# Patient Record
Sex: Male | Born: 1950
Health system: Southern US, Community
[De-identification: ages and names within clinical notes are randomized; demographics above are authoritative.]

## PROBLEM LIST (undated history)

## (undated) DIAGNOSIS — E785 Hyperlipidemia, unspecified: Secondary | ICD-10-CM

## (undated) DIAGNOSIS — I10 Essential (primary) hypertension: Secondary | ICD-10-CM

---

## 2006-11-29 ENCOUNTER — Other Ambulatory Visit: Payer: Self-pay

## 2006-11-29 ENCOUNTER — Emergency Department: Payer: Self-pay

## 2007-04-30 ENCOUNTER — Encounter: Admission: RE | Admit: 2007-04-30 | Discharge: 2007-04-30 | Payer: Self-pay | Admitting: Internal Medicine

## 2008-07-10 ENCOUNTER — Ambulatory Visit: Payer: Self-pay | Admitting: Otolaryngology

## 2009-07-09 ENCOUNTER — Ambulatory Visit: Payer: Self-pay | Admitting: Unknown Physician Specialty

## 2015-04-09 ENCOUNTER — Other Ambulatory Visit: Payer: Self-pay | Admitting: Orthopedic Surgery

## 2015-04-09 DIAGNOSIS — M67431 Ganglion, right wrist: Secondary | ICD-10-CM

## 2015-04-09 DIAGNOSIS — G5621 Lesion of ulnar nerve, right upper limb: Secondary | ICD-10-CM

## 2015-04-16 ENCOUNTER — Ambulatory Visit
Admission: RE | Admit: 2015-04-16 | Discharge: 2015-04-16 | Disposition: A | Discharge status: Home / Self Care | Payer: BLUE CROSS/BLUE SHIELD | Source: Ambulatory Visit | Attending: Orthopedic Surgery | Admitting: Orthopedic Surgery

## 2015-04-16 DIAGNOSIS — G5621 Lesion of ulnar nerve, right upper limb: Secondary | ICD-10-CM

## 2015-04-16 DIAGNOSIS — I728 Aneurysm of other specified arteries: Secondary | ICD-10-CM | POA: Diagnosis not present

## 2015-04-16 DIAGNOSIS — M659 Synovitis and tenosynovitis, unspecified: Secondary | ICD-10-CM | POA: Insufficient documentation

## 2015-04-16 DIAGNOSIS — M67431 Ganglion, right wrist: Secondary | ICD-10-CM

## 2015-04-16 DIAGNOSIS — M19031 Primary osteoarthritis, right wrist: Secondary | ICD-10-CM | POA: Insufficient documentation

## 2015-04-16 DIAGNOSIS — M25431 Effusion, right wrist: Secondary | ICD-10-CM | POA: Insufficient documentation

## 2015-05-27 ENCOUNTER — Encounter
Admission: RE | Disposition: A | Discharge status: Home / Self Care | Payer: Self-pay | Source: Ambulatory Visit | Attending: Vascular Surgery

## 2015-05-27 ENCOUNTER — Encounter: Payer: Self-pay | Admitting: *Deleted

## 2015-05-27 ENCOUNTER — Ambulatory Visit
Admission: RE | Admit: 2015-05-27 | Discharge: 2015-05-27 | Disposition: A | Discharge status: Home / Self Care | Payer: BLUE CROSS/BLUE SHIELD | Source: Ambulatory Visit | Attending: Vascular Surgery | Admitting: Vascular Surgery

## 2015-05-27 DIAGNOSIS — Z7982 Long term (current) use of aspirin: Secondary | ICD-10-CM | POA: Diagnosis not present

## 2015-05-27 DIAGNOSIS — I7389 Other specified peripheral vascular diseases: Secondary | ICD-10-CM | POA: Insufficient documentation

## 2015-05-27 DIAGNOSIS — E785 Hyperlipidemia, unspecified: Secondary | ICD-10-CM | POA: Insufficient documentation

## 2015-05-27 DIAGNOSIS — I10 Essential (primary) hypertension: Secondary | ICD-10-CM | POA: Diagnosis not present

## 2015-05-27 DIAGNOSIS — Z79899 Other long term (current) drug therapy: Secondary | ICD-10-CM | POA: Diagnosis not present

## 2015-05-27 HISTORY — DX: Hyperlipidemia, unspecified: E78.5

## 2015-05-27 HISTORY — PX: PERIPHERAL VASCULAR CATHETERIZATION: SHX172C

## 2015-05-27 HISTORY — DX: Essential (primary) hypertension: I10

## 2015-05-27 LAB — BASIC METABOLIC PANEL
Anion gap: 8 (ref 5–15)
BUN: 15 mg/dL (ref 6–20)
CALCIUM: 9.3 mg/dL (ref 8.9–10.3)
CO2: 25 mmol/L (ref 22–32)
CREATININE: 0.88 mg/dL (ref 0.61–1.24)
Chloride: 105 mmol/L (ref 101–111)
GFR calc non Af Amer: >60 mL/min (ref >60)
Glucose, Bld: 125 mg/dL — ABNORMAL HIGH (ref 65–99)
Potassium: 4.2 mmol/L (ref 3.5–5.1)
Sodium: 138 mmol/L (ref 135–145)

## 2015-05-27 SURGERY — UPPER EXTREMITY ANGIOGRAPHY
Anesthesia: Moderate Sedation | Laterality: Right

## 2015-05-27 MED ORDER — MIDAZOLAM HCL 5 MG/5ML IJ SOLN
INTRAMUSCULAR | Status: AC
  Filled 2015-05-27: qty 5

## 2015-05-27 MED ORDER — DEXTROSE 50 % IV SOLN: 0.5000 | Freq: Once | INTRAVENOUS | Status: DC | PRN

## 2015-05-27 MED ORDER — FENTANYL CITRATE (PF) 100 MCG/2ML IJ SOLN
INTRAMUSCULAR | Status: AC
  Filled 2015-05-27: qty 2

## 2015-05-27 MED ORDER — HEPARIN (PORCINE) IN NACL 2-0.9 UNIT/ML-% IJ SOLN
INTRAMUSCULAR | Status: AC
  Filled 2015-05-27: qty 1000

## 2015-05-27 MED ORDER — CEFAZOLIN SODIUM 1-5 GM-% IV SOLN
1.0000 g | Freq: Once | INTRAVENOUS | Status: AC
  Administered 2015-05-27: 1 g via INTRAVENOUS

## 2015-05-27 MED ORDER — CEFAZOLIN SODIUM 1-5 GM-% IV SOLN
INTRAVENOUS | Status: AC
  Filled 2015-05-27: qty 50

## 2015-05-27 MED ORDER — LIDOCAINE-EPINEPHRINE (PF) 1 %-1:200000 IJ SOLN
INTRAMUSCULAR | Status: AC
  Filled 2015-05-27: qty 30

## 2015-05-27 MED ORDER — HEPARIN SODIUM (PORCINE) 1000 UNIT/ML IJ SOLN
INTRAMUSCULAR | Status: AC
  Filled 2015-05-27: qty 1

## 2015-05-27 MED ORDER — HYDROMORPHONE HCL 1 MG/ML IJ SOLN: 1.0000 mg | INTRAMUSCULAR | Status: DC | PRN

## 2015-05-27 MED ORDER — MIDAZOLAM HCL 2 MG/2ML IJ SOLN
INTRAMUSCULAR | Status: DC | PRN
  Administered 2015-05-27: 2 mg via INTRAVENOUS

## 2015-05-27 MED ORDER — IOHEXOL 300 MG/ML  SOLN
INTRAMUSCULAR | Status: DC | PRN
  Administered 2015-05-27: 45 mL via INTRA_ARTERIAL

## 2015-05-27 MED ORDER — SODIUM CHLORIDE 0.9 % IV SOLN
INTRAVENOUS | Status: DC
  Administered 2015-05-27: 11:00:00 via INTRAVENOUS

## 2015-05-27 MED ORDER — FENTANYL CITRATE (PF) 100 MCG/2ML IJ SOLN
INTRAMUSCULAR | Status: DC | PRN
Start: 2015-05-27 — End: 2015-05-27
  Administered 2015-05-27: 50 ug via INTRAVENOUS

## 2015-05-27 MED ORDER — ONDANSETRON HCL 4 MG/2ML IJ SOLN: 4.0000 mg | INTRAMUSCULAR | Status: DC | PRN

## 2015-05-27 MED ORDER — HEPARIN SODIUM (PORCINE) 1000 UNIT/ML IJ SOLN
INTRAMUSCULAR | Status: DC | PRN
  Administered 2015-05-27: 3000 [IU] via INTRAVENOUS

## 2015-05-27 SURGICAL SUPPLY — 11 items
CATH CXI SUPP ANG 4FR 135 (MICROCATHETER) ×1 IMPLANT
CATH CXI SUPP ANG 4FR 135CM (MICROCATHETER) ×2
CATH H1 100CM (CATHETERS) ×2 IMPLANT
CATH PIG 5.0X110 10S (CATHETERS) ×2 IMPLANT
DEVICE STARCLOSE SE CLOSURE (Vascular Products) ×2 IMPLANT
GLIDEWIRE ANGLED SS 035X260CM (WIRE) ×2 IMPLANT
PACK ANGIOGRAPHY (CUSTOM PROCEDURE TRAY) ×2 IMPLANT
SHEATH BRITE TIP 5FRX11 (SHEATH) ×2 IMPLANT
SYR MEDRAD MARK V 150ML (SYRINGE) ×2 IMPLANT
TUBING CONTRAST HIGH PRESS 72 (TUBING) ×2 IMPLANT
WIRE J 3MM .035X145CM (WIRE) ×2 IMPLANT

## 2015-05-27 NOTE — H&P (Signed)
[facility] History & Physical Update  The patient was interviewed and re-examined.  The patient's previous History and Physical has been reviewed and is unchanged.  There is no change in the plan of care. We plan to proceed with the scheduled procedure.  Amarisa Wilinski, MD  05/27/2015, 9:52 AM

## 2015-05-27 NOTE — CV Procedure (Signed)
OPERATIVE REPORT   PREOPERATIVE DIAGNOSIS: 1. Hypothenar hammer syndrome right hand  POSTOPERATIVE DIAGNOSIS: 1. Same as above  PROCEDURE PERFORMED: 1. Ultrasound guidance vascular access to right femoral artery. 2. Catheter placement to right radial artery and right ulnar arteries  from right femoral approach. 3. Thoracic aortogram and selective right upper extremity angiogram  including selective images of the radial and ulnar arteries. 4. StarClose closure device right femoral artery.  SURGEON: Algernon Huxley, MD  ANESTHESIA: Local with moderate conscious sedation.  BLOOD LOSS: Minimal.  FLUOROSCOPY TIME: 3 minutes  INDICATION FOR PROCEDURE: This is a 64 year old male who presented to our office with a referral from his orthopedic surgeon after evaluation for right hand pain and weakness. He was found to have hypo-thenar hammer syndrome.To further evaluate this to determine what options would be possible to treat the condition, angiogram of the right upper extremity is indicated. Risks and benefits are discussed. Informed consent was obtained.  DESCRIPTION OF PROCEDURE: The patient was brought to the vascular suite. Groins were shaved and prepped and sterile surgical field was created. The right femoral head was localized with fluoroscopy and the right femoral artery was then visualized with ultrasound and found to be widely patent. It was then accessed under direct ultrasound guidance without difficulty with a Seldinger needle and a permanent image was recorded. A J-wire and 5-French sheath were then placed. Pigtail catheter was placed into the ascending aorta and a thoracic aortogram was then performed in the LAO projection. This demonstrated normal origins to the great vessels without significant proximal stenoses but a bovine configuration was present. The patient was given 3000 units of intravenous heparin and a headhunter catheter was used to  selectively cannulate the innominate artery and advanced into the right subclavian artery without difficulty. This was then sequentially advanced to the brachial artery and to the brachial bifurcation.  Images were performed with injections in the brachial artery and then selectively into the radial artery and ulnar artery. The radial artery was entered first, but the catheter only went to the very origin of the radial artery, then exchanged for a 135 cm CXI catheter to evaluate more distally in the radial artery. After this, the catheter was pulled back into the brachial artery and then advanced and placed into the ulnar artery, this was evaluated. Findings in the right upper extremity showed no significant atherosclerotic disease. The subclavian artery, axillary artery, and brachial arteries were widely patent and normal. There was a normal brachial artery bifurcation. The interosseous artery did not have a lot of flow. The radial artery was normal but the palmaris arch was incomplete and the radial artery only supplied the thumb and the lateral aspect of the second finger. The ulnar artery supplied the medial 3 fingers in the medial aspect of the index finger. Again the palmar arch was seen to be incomplete without good flow. The ulnar artery became quite tortuous at the wrist although a true aneurysm was not really seen. The artery was continuous and not thrombosed. There was hyperemic flow with quick venous refill seen throughout the hand and the right upper extremity. This was most prominent with a direct ulnar injection. At this point, there was no real endovascular option for any of his issues and I elected to terminate his procedure. He has already been referred to a hand surgeon for further evaluation.  The diagnostic catheter was removed. Oblique arteriogram was performed of the right femoral artery and StarClose closure device deployed in the usual  fashion with excellent hemostatic result.  The patient tolerated the procedure well and was taken to the recovery room in stable condition.   Simone Rodenbeck 05/27/2015 1:59 PM

## 2015-05-27 NOTE — Discharge Instructions (Signed)
Groin Insertion Instructions-If you lose feeling or develop tingling or pain in your leg or foot after the procedure, please walk around first.  If the discomfort does not improve , contact your physician and proceed to the nearest emergency room.  Loss of feeling in your leg might mean that a blockage has formed in the artery and this can be appropriately treated.  Limit your activity for the next two days after your procedure.  Avoid stooping, bending, heavy lifting or exertion as this may put pressure on the insertion site.  Resume normal activities in 48 hours.  You may shower after 24 hours but avoid excessive warm water and do not scrub the site.  Remove clear dressing in 48 hours.  If you have had a closure device inserted, do not soak in a tub bath or a hot tub for at least one week.  No driving for 48 hours after discharge.  After the procedure, check the insertion site occasionally.  If any oozing occurs or there is apparent swelling, firm pressure over the site will prevent a bruise from forming.  You can not hurt anything by pressing directly on the site.  The pressure stops the bleeding by allowing a small clot to form.  If the bleeding continues after the pressure has been applied for more than 15 minutes, call 911 or go to the nearest emergency room.    The x-ray dye causes you to pass a considerate amount of urine.  For this reason, you will be asked to drink plenty of liquids after the procedure to prevent dehydration.  You may resume you regular diet.  Avoid caffeine products.    For pain at the site of your procedure, take non-aspirin medicines such as Tylenol.  Medications: A. Hold Metformin for 48 hours if applicable.  B. Continue taking all your present medications at home unless your doctor prescribes any changes.Angiogram, Care After Refer to this sheet in the next few weeks. These instructions provide you with information on caring for yourself after your procedure. Your health care  provider may also give you more specific instructions. Your treatment has been planned according to current medical practices, but problems sometimes occur. Call your health care provider if you have any problems or questions after your procedure.  WHAT TO EXPECT AFTER THE PROCEDURE After your procedure, it is typical to have the following sensations:  Minor discomfort or tenderness and a small bump at the catheter insertion site. The bump should usually decrease in size and tenderness within 1 to 2 weeks.  Any bruising will usually fade within 2 to 4 weeks. HOME CARE INSTRUCTIONS   You may need to keep taking blood thinners if they were prescribed for you. Take medicines only as directed by your health care provider.  Do not apply powder or lotion to the site.  Do not take baths, swim, or use a hot tub until your health care provider approves.  You may shower 24 hours after the procedure. Remove the bandage (dressing) and gently wash the site with plain soap and water. Gently pat the site dry.  Inspect the site at least twice daily.  Limit your activity for the first 48 hours. Do not bend, squat, or lift anything over 20 lb (9 kg) or as directed by your health care provider.  Plan to have someone take you home after the procedure. Follow instructions about when you can drive or return to work. SEEK MEDICAL CARE IF:  You get light-headed when  standing up.  You have drainage (other than a small amount of blood on the dressing).  You have chills.  You have a fever.  You have redness, warmth, swelling, or pain at the insertion site. SEEK IMMEDIATE MEDICAL CARE IF:   You develop chest pain or shortness of breath, feel faint, or pass out.  You have bleeding, swelling larger than a walnut, or drainage from the catheter insertion site.  You develop pain, discoloration, coldness, or severe bruising in the leg or arm that held the catheter.  You develop bleeding from any other place,  such as the bowels. You may see bright red blood in your urine or stools, or your stools may appear black and tarry.  You have heavy bleeding from the site. If this happens, hold pressure on the site. MAKE SURE YOU:  Understand these instructions.  Will watch your condition.  Will get help right away if you are not doing well or get worse. Document Released: 04/27/2005 Document Revised: 02/23/2014 Document Reviewed: 03/03/2013 [facility] Patient Information 2015 [place], [place]. This information is not intended to replace advice given to you by your health care provider. Make sure you discuss any questions you have with your health care provider.

## 2015-05-28 ENCOUNTER — Encounter: Payer: Self-pay | Admitting: Vascular Surgery

## 2016-09-05 ENCOUNTER — Encounter: Payer: Self-pay | Admitting: Podiatry

## 2016-09-05 ENCOUNTER — Ambulatory Visit (INDEPENDENT_AMBULATORY_CARE_PROVIDER_SITE_OTHER): Payer: PRIVATE HEALTH INSURANCE

## 2016-09-05 ENCOUNTER — Ambulatory Visit (INDEPENDENT_AMBULATORY_CARE_PROVIDER_SITE_OTHER): Payer: BLUE CROSS/BLUE SHIELD | Admitting: Podiatry

## 2016-09-05 DIAGNOSIS — M79671 Pain in right foot: Secondary | ICD-10-CM

## 2016-09-05 DIAGNOSIS — M67471 Ganglion, right ankle and foot: Secondary | ICD-10-CM

## 2016-09-05 DIAGNOSIS — M7751 Other enthesopathy of right foot: Secondary | ICD-10-CM | POA: Diagnosis not present

## 2016-09-17 MED ORDER — BETAMETHASONE SOD PHOS & ACET 6 (3-3) MG/ML IJ SUSP: 3.0000 mg | Freq: Once | INTRAMUSCULAR | Status: DC

## 2016-09-17 NOTE — Progress Notes (Signed)
Subjective:  Patient presents today for evaluation of and not developed on his right big toe. Patient states that the lump has been present for the past 3-4 months. Patient denies trauma. Patient presents for further treatment and evaluation.    Objective/Physical Exam General: The patient is alert and oriented x3 in no acute distress.  Dermatology: Skin is warm, dry and supple bilateral lower extremities. Negative for open lesions or macerations.  Vascular: Palpable pedal pulses bilaterally. No edema or erythema noted. Capillary refill within normal limits.  Neurological: Epicritic and protective threshold grossly intact bilaterally.   Musculoskeletal Exam: Palpable, fluctuant nodule noted to the dorsal lateral aspect of the right great toe. Clinical evidence of hallux valgus deformity with possible spurring noted.   Radiographic Exam:  Increased IM angle with osteoarthritic changes noted to the 1st MPJ right foot. Mild dorsal spurring noted.   Assessment: #1 ganglion cyst first MPJ right foot #2 possible abductovalgus right foot #3 DJD first MPJ right foot #4 pain in right Her 5 capsulitis first MPJ right foot   Plan of Care:  #1 Patient was evaluated. #2 injection 0.5 mL Celestone Soluspan injected in the first MPJ right foot #3 rupture of the ganglion cyst was performed with direct pressure and palpation. #4 present dressing applied #5 return to clinic when necessary   Dr. Edrick Kins, [place]

## 2016-11-21 ENCOUNTER — Ambulatory Visit: Payer: PRIVATE HEALTH INSURANCE | Admitting: Podiatry

## 2017-02-26 ENCOUNTER — Ambulatory Visit (INDEPENDENT_AMBULATORY_CARE_PROVIDER_SITE_OTHER): Payer: BLUE CROSS/BLUE SHIELD

## 2017-02-26 ENCOUNTER — Ambulatory Visit (INDEPENDENT_AMBULATORY_CARE_PROVIDER_SITE_OTHER): Payer: BLUE CROSS/BLUE SHIELD | Admitting: Podiatry

## 2017-02-26 ENCOUNTER — Encounter: Payer: Self-pay | Admitting: Podiatry

## 2017-02-26 DIAGNOSIS — M79671 Pain in right foot: Secondary | ICD-10-CM

## 2017-02-26 DIAGNOSIS — M205X1 Other deformities of toe(s) (acquired), right foot: Secondary | ICD-10-CM | POA: Diagnosis not present

## 2017-02-26 DIAGNOSIS — M67471 Ganglion, right ankle and foot: Secondary | ICD-10-CM

## 2017-02-26 NOTE — Progress Notes (Signed)
Subjective:    Patient ID: Gary Wallace, male   DOB: 66 y.o.   MRN: [id number]   HPI patient presents stating he is ready to get his right foot worked on and states that the cyst is back and he does have arthritis in the big toe joint    ROS      Objective:  Physical Exam Neurovascular status intact negative Homans sign was noted with patient's first metatarsal right showing reduced range of motion with approximate 15 of dorsiflexion 10 plantar flexion with no crepitus the joint surface noted. There is a small cyst on the medial side of the right first metatarsal that's freely movable and has been drained with immediate reoccurrence of the lesion    Assessment:    Hallux limitus rigidus deformity right with spur formation but no crepitus of the joint and probable ganglionic cyst     Plan:   H&P and both conditions reviewed. Due to the fact that the joint is functioning relatively well with just large dorsal spurring I've recommended excision of all spurs with modified McBride type bunionectomy to free the joint with the possibility of osteotomy if the symptoms indicate or other treatments depending on the condition of the joint surface. I also recommended excision of the cyst from the right first metatarsal and reviewed the procedure and risk and I allowed patient to read consent form going over alternative treatments complications associated with this surgery. Patient wants surgery understanding everything as listed and is scheduled for outpatient surgery and is dispensed air fracture walker with instructions on usage. Patient is instructed to call systemic any questions prior to procedure and is scheduled for procedure and the next several weeks

## 2017-02-26 NOTE — Patient Instructions (Signed)
Pre-Operative Instructions  Congratulations, you have decided to take an important step to improving your quality of life.  You can be assured that the doctors of [facility] will be with you every step of the way.  1. Plan to be at the surgery center/hospital at least 1 (one) hour prior to your scheduled time unless otherwise directed by the surgical center/hospital staff.  You must have a responsible adult accompany you, remain during the surgery and drive you home.  Make sure you have directions to the surgical center/hospital and know how to get there on time. 2. For hospital based surgery you will need to obtain a history and physical form from your family physician within 1 month prior to the date of surgery- we will give you a form for you primary physician.  3. We make every effort to accommodate the date you request for surgery.  There are however, times where surgery dates or times have to be moved.  We will contact you as soon as possible if a change in schedule is required.   4. No Aspirin/Ibuprofen for one week before surgery.  If you are on aspirin, any non-steroidal anti-inflammatory medications (Mobic, Aleve, Ibuprofen) you should stop taking it 7 days prior to your surgery.  You make take Tylenol  For pain prior to surgery.  5. Medications- If you are taking daily heart and blood pressure medications, seizure, reflux, allergy, asthma, anxiety, pain or diabetes medications, make sure the surgery center/hospital is aware before the day of surgery so they may notify you which medications to take or avoid the day of surgery. 6. No food or drink after midnight the night before surgery unless directed otherwise by surgical center/hospital staff. 7. No alcoholic beverages 24 hours prior to surgery.  No smoking 24 hours prior to or 24 hours after surgery. 8. Wear loose pants or shorts- loose enough to fit over bandages, boots, and casts. 9. No slip on shoes, sneakers are best. 10. Bring  your boot with you to the surgery center/hospital.  Also bring crutches or a walker if your physician has prescribed it for you.  If you do not have this equipment, it will be provided for you after surgery. 11. If you have not been contracted by the surgery center/hospital by the day before your surgery, call to confirm the date and time of your surgery. 12. Leave-time from work may vary depending on the type of surgery you have.  Appropriate arrangements should be made prior to surgery with your employer. 13. Prescriptions will be provided immediately following surgery by your doctor.  Have these filled as soon as possible after surgery and take the medication as directed. 14. Remove nail polish on the operative foot. 15. Wash the night before surgery.  The night before surgery wash the foot and leg well with the antibacterial soap provided and water paying special attention to beneath the toenails and in between the toes.  Rinse thoroughly with water and dry well with a towel.  Perform this wash unless told not to do so by your physician.  Enclosed: 1 Ice pack (please put in freezer the night before surgery)   1 Hibiclens skin cleaner   Pre-op Instructions  If you have any questions regarding the instructions, do not hesitate to call our office.  [place]: [address] [place], [place] [address] [phone]  [place]: [address] [place], [place] [address] [phone]  [place]: [address]  [place], [place] [address] [phone]   Dr.   Stassi Fadely DPM, Dr. Matthew Wagoner DPM, Dr. M. Todd Hyatt DPM, Dr. Titorya Stover DPM 

## 2017-03-13 ENCOUNTER — Encounter: Payer: Self-pay | Admitting: Podiatry

## 2017-03-13 DIAGNOSIS — M67471 Ganglion, right ankle and foot: Secondary | ICD-10-CM | POA: Diagnosis not present

## 2017-03-13 DIAGNOSIS — M2021 Hallux rigidus, right foot: Secondary | ICD-10-CM

## 2017-03-16 ENCOUNTER — Encounter: Payer: Self-pay | Admitting: Podiatry

## 2017-03-22 ENCOUNTER — Ambulatory Visit (INDEPENDENT_AMBULATORY_CARE_PROVIDER_SITE_OTHER): Payer: BLUE CROSS/BLUE SHIELD

## 2017-03-22 ENCOUNTER — Ambulatory Visit (INDEPENDENT_AMBULATORY_CARE_PROVIDER_SITE_OTHER): Payer: BLUE CROSS/BLUE SHIELD | Admitting: Podiatry

## 2017-03-22 VITALS — Temp 97.2°F

## 2017-03-22 DIAGNOSIS — M67471 Ganglion, right ankle and foot: Secondary | ICD-10-CM

## 2017-03-22 DIAGNOSIS — M205X1 Other deformities of toe(s) (acquired), right foot: Secondary | ICD-10-CM

## 2017-03-22 NOTE — Progress Notes (Signed)
Subjective:    Patient ID: Gary Wallace, male   DOB: 66 y.o.   MRN: [id number]   HPI patient states doing well with his right foot with minimal discomfort or swelling    ROS      Objective:  Physical Exam Neurovascular status intact negative Homans sign was noted with well coapted incision site good alignment and good range of motion first MPJ    Assessment:    Doing well overall foot surgery right     Plan:    H&P and x-ray reviewed and applied sterile dressing advised to continue compression immobilization elevation and reappoint 3 weeks or earlier if needed

## 2017-03-23 NOTE — Progress Notes (Signed)
DOS 05.22.2018 Removal bone spur from big toe joint right, removal ganglion sack right 1st MPJ.

## 2017-04-05 ENCOUNTER — Ambulatory Visit (INDEPENDENT_AMBULATORY_CARE_PROVIDER_SITE_OTHER): Payer: BLUE CROSS/BLUE SHIELD

## 2017-04-05 ENCOUNTER — Ambulatory Visit (INDEPENDENT_AMBULATORY_CARE_PROVIDER_SITE_OTHER): Payer: BLUE CROSS/BLUE SHIELD | Admitting: Podiatry

## 2017-04-05 DIAGNOSIS — M67471 Ganglion, right ankle and foot: Secondary | ICD-10-CM

## 2017-04-05 DIAGNOSIS — M205X1 Other deformities of toe(s) (acquired), right foot: Secondary | ICD-10-CM | POA: Diagnosis not present

## 2017-04-05 NOTE — Progress Notes (Signed)
Subjective:    Patient ID: Gary Wallace, male   DOB: 66 y.o.   MRN: [id number]   HPI patient states that he's doing well with this surgery    ROS      Objective:  Physical Exam neurovascular status intact negative Homans sign was noted with wound edges well coapted first metatarsal with mild restriction of motion     Assessment:    Mild restriction of motion first MPJ right because patient's not been working it well but overall healing well     Plan:    Advised on the importance of range of motion and he will begin wearing regular shoes and I dispensed ankle compression stocking and discussed continued elevation. Reappoint 4 weeks or earlier if needed  X-rays indicate satisfactory bone resection

## 2017-05-03 ENCOUNTER — Ambulatory Visit (INDEPENDENT_AMBULATORY_CARE_PROVIDER_SITE_OTHER): Payer: Self-pay | Admitting: Podiatry

## 2017-05-03 ENCOUNTER — Ambulatory Visit (INDEPENDENT_AMBULATORY_CARE_PROVIDER_SITE_OTHER): Payer: BLUE CROSS/BLUE SHIELD

## 2017-05-03 DIAGNOSIS — M205X1 Other deformities of toe(s) (acquired), right foot: Secondary | ICD-10-CM

## 2017-05-03 DIAGNOSIS — M67471 Ganglion, right ankle and foot: Secondary | ICD-10-CM

## 2017-05-03 NOTE — Progress Notes (Signed)
Subjective:    Patient ID: Gary Wallace, male   DOB: 66 y.o.   MRN: [id number]   HPI patient points to right foot stating he's doing much better    ROS      Objective:  Physical Exam neurovascular status intact with range of motion of approximate 25 dorsiflexion 20 plantar flexion with no pain no crepitus noted     Assessment:   Doing well post hallux limitus surgery right     Plan:    Reviewed final x-ray and advised patient to return to normal activity and will be seen back as needed and may still require orthotics  X-rays indicate that the osteotomy and the bone spur removal has healed well

## 2017-06-21 ENCOUNTER — Ambulatory Visit: Payer: BLUE CROSS/BLUE SHIELD | Admitting: Podiatry

## 2017-07-18 ENCOUNTER — Ambulatory Visit: Payer: 59 | Attending: Otolaryngology

## 2017-07-18 DIAGNOSIS — G4761 Periodic limb movement disorder: Secondary | ICD-10-CM | POA: Diagnosis not present

## 2017-07-18 DIAGNOSIS — F5101 Primary insomnia: Secondary | ICD-10-CM | POA: Diagnosis not present

## 2017-07-18 DIAGNOSIS — G4733 Obstructive sleep apnea (adult) (pediatric): Secondary | ICD-10-CM | POA: Diagnosis present

## 2017-08-01 ENCOUNTER — Ambulatory Visit: Payer: 59 | Attending: Otolaryngology

## 2017-08-01 DIAGNOSIS — G4733 Obstructive sleep apnea (adult) (pediatric): Secondary | ICD-10-CM | POA: Diagnosis present

## 2017-08-01 DIAGNOSIS — F5101 Primary insomnia: Secondary | ICD-10-CM | POA: Insufficient documentation

## 2017-08-01 DIAGNOSIS — G4761 Periodic limb movement disorder: Secondary | ICD-10-CM | POA: Diagnosis not present

## 2018-09-30 ENCOUNTER — Other Ambulatory Visit: Payer: Self-pay | Admitting: Internal Medicine

## 2018-09-30 DIAGNOSIS — R109 Unspecified abdominal pain: Secondary | ICD-10-CM

## 2018-10-04 ENCOUNTER — Ambulatory Visit
Admission: RE | Admit: 2018-10-04 | Discharge: 2018-10-04 | Disposition: A | Discharge status: Home / Self Care | Payer: 59 | Source: Ambulatory Visit | Attending: Internal Medicine | Admitting: Internal Medicine

## 2018-10-04 DIAGNOSIS — R109 Unspecified abdominal pain: Secondary | ICD-10-CM | POA: Insufficient documentation

## 2018-10-24 DIAGNOSIS — R140 Abdominal distension (gaseous): Secondary | ICD-10-CM | POA: Diagnosis not present

## 2018-10-24 DIAGNOSIS — E669 Obesity, unspecified: Secondary | ICD-10-CM | POA: Diagnosis not present

## 2018-10-24 DIAGNOSIS — G4731 Primary central sleep apnea: Secondary | ICD-10-CM | POA: Diagnosis not present

## 2018-10-24 DIAGNOSIS — M76899 Other specified enthesopathies of unspecified lower limb, excluding foot: Secondary | ICD-10-CM | POA: Diagnosis not present

## 2018-11-12 DIAGNOSIS — G4733 Obstructive sleep apnea (adult) (pediatric): Secondary | ICD-10-CM | POA: Diagnosis not present

## 2018-12-26 DIAGNOSIS — H524 Presbyopia: Secondary | ICD-10-CM | POA: Diagnosis not present

## 2018-12-26 DIAGNOSIS — E113393 Type 2 diabetes mellitus with moderate nonproliferative diabetic retinopathy without macular edema, bilateral: Secondary | ICD-10-CM | POA: Diagnosis not present

## 2019-01-07 ENCOUNTER — Other Ambulatory Visit: Payer: Self-pay

## 2019-01-07 ENCOUNTER — Ambulatory Visit (INDEPENDENT_AMBULATORY_CARE_PROVIDER_SITE_OTHER): Payer: Medicare HMO

## 2019-01-07 ENCOUNTER — Encounter: Payer: Self-pay | Admitting: Podiatry

## 2019-01-07 ENCOUNTER — Ambulatory Visit: Payer: Medicare HMO | Admitting: Podiatry

## 2019-01-07 DIAGNOSIS — M67471 Ganglion, right ankle and foot: Secondary | ICD-10-CM | POA: Diagnosis not present

## 2019-01-07 DIAGNOSIS — M19071 Primary osteoarthritis, right ankle and foot: Secondary | ICD-10-CM | POA: Diagnosis not present

## 2019-01-08 DIAGNOSIS — E669 Obesity, unspecified: Secondary | ICD-10-CM | POA: Diagnosis not present

## 2019-01-08 DIAGNOSIS — G4731 Primary central sleep apnea: Secondary | ICD-10-CM | POA: Diagnosis not present

## 2019-01-08 DIAGNOSIS — E785 Hyperlipidemia, unspecified: Secondary | ICD-10-CM | POA: Diagnosis not present

## 2019-01-08 DIAGNOSIS — I10 Essential (primary) hypertension: Secondary | ICD-10-CM | POA: Diagnosis not present

## 2019-01-10 NOTE — Progress Notes (Signed)
   HPI: 68 year old male presents the office today for evaluation regarding a possible ganglion cyst of the right foot.  Patient is concerned that he does have a ganglion cyst as he is he was diagnosed with this several years ago.  He does have a history of undergoing surgical care in the past by one of our physicians here in the practice.  He denies any pain at the moment.  He denies any trauma.  Past Medical History:  Diagnosis Date  . Hyperlipemia   . Hypertension      Physical Exam: General: The patient is alert and oriented x3 in no acute distress.  Dermatology: Skin is warm, dry and supple bilateral lower extremities. Negative for open lesions or macerations.  Vascular: Palpable pedal pulses bilaterally. No edema or erythema noted. Capillary refill within normal limits.  Neurological: Epicritic and protective threshold grossly intact bilaterally.   Musculoskeletal Exam: Range of motion within normal limits to all pedal and ankle joints bilateral. Muscle strength 5/5 in all groups bilateral.  There is a palpable nodule consistent with possible bone spur to the dorsum of the foot.  This nodule is well adhered and not fluctuant.  Radiographic Exam:  Normal osseous mineralization. Joint spaces preserved. No fracture/dislocation/boney destruction.  Joint space narrowing noted to the first MTPJ of the right foot consistent with a hallux limitus  Assessment: 1.  Bone spur right foot   Plan of Care:  1. Patient evaluated. X-Rays reviewed.  2.  Recommend the patient wear good supportive shoe gear 3.  Over-the-counter Motrin as needed 4.  Return to clinic as needed      Edrick Kins, DPM [facility]  Dr. Edrick Kins, DPM    [address], [place] [address]                Office [phone]  Fax [phone]

## 2019-03-04 DIAGNOSIS — H02831 Dermatochalasis of right upper eyelid: Secondary | ICD-10-CM | POA: Diagnosis not present

## 2019-03-04 DIAGNOSIS — H2511 Age-related nuclear cataract, right eye: Secondary | ICD-10-CM | POA: Diagnosis not present

## 2019-03-04 DIAGNOSIS — H25013 Cortical age-related cataract, bilateral: Secondary | ICD-10-CM | POA: Diagnosis not present

## 2019-03-04 DIAGNOSIS — H2513 Age-related nuclear cataract, bilateral: Secondary | ICD-10-CM | POA: Diagnosis not present

## 2019-03-04 DIAGNOSIS — H25043 Posterior subcapsular polar age-related cataract, bilateral: Secondary | ICD-10-CM | POA: Diagnosis not present

## 2019-03-13 DIAGNOSIS — H2511 Age-related nuclear cataract, right eye: Secondary | ICD-10-CM | POA: Diagnosis not present

## 2019-03-14 DIAGNOSIS — H2512 Age-related nuclear cataract, left eye: Secondary | ICD-10-CM | POA: Diagnosis not present

## 2019-03-14 DIAGNOSIS — H2511 Age-related nuclear cataract, right eye: Secondary | ICD-10-CM | POA: Diagnosis not present

## 2019-03-14 DIAGNOSIS — Z961 Presence of intraocular lens: Secondary | ICD-10-CM | POA: Diagnosis not present

## 2019-03-28 DIAGNOSIS — H2511 Age-related nuclear cataract, right eye: Secondary | ICD-10-CM | POA: Diagnosis not present

## 2019-03-28 DIAGNOSIS — H2512 Age-related nuclear cataract, left eye: Secondary | ICD-10-CM | POA: Diagnosis not present

## 2019-04-07 DIAGNOSIS — E669 Obesity, unspecified: Secondary | ICD-10-CM | POA: Diagnosis not present

## 2019-04-07 DIAGNOSIS — G4731 Primary central sleep apnea: Secondary | ICD-10-CM | POA: Diagnosis not present

## 2019-04-07 DIAGNOSIS — I10 Essential (primary) hypertension: Secondary | ICD-10-CM | POA: Diagnosis not present

## 2019-04-07 DIAGNOSIS — M76899 Other specified enthesopathies of unspecified lower limb, excluding foot: Secondary | ICD-10-CM | POA: Diagnosis not present

## 2019-04-08 DIAGNOSIS — L538 Other specified erythematous conditions: Secondary | ICD-10-CM | POA: Diagnosis not present

## 2019-04-08 DIAGNOSIS — L821 Other seborrheic keratosis: Secondary | ICD-10-CM | POA: Diagnosis not present

## 2019-04-08 DIAGNOSIS — D225 Melanocytic nevi of trunk: Secondary | ICD-10-CM | POA: Diagnosis not present

## 2019-04-08 DIAGNOSIS — D2272 Melanocytic nevi of left lower limb, including hip: Secondary | ICD-10-CM | POA: Diagnosis not present

## 2019-04-08 DIAGNOSIS — D2262 Melanocytic nevi of left upper limb, including shoulder: Secondary | ICD-10-CM | POA: Diagnosis not present

## 2019-04-08 DIAGNOSIS — D2261 Melanocytic nevi of right upper limb, including shoulder: Secondary | ICD-10-CM | POA: Diagnosis not present

## 2019-04-08 DIAGNOSIS — X32XXXA Exposure to sunlight, initial encounter: Secondary | ICD-10-CM | POA: Diagnosis not present

## 2019-04-08 DIAGNOSIS — E119 Type 2 diabetes mellitus without complications: Secondary | ICD-10-CM | POA: Diagnosis not present

## 2019-04-08 DIAGNOSIS — D2271 Melanocytic nevi of right lower limb, including hip: Secondary | ICD-10-CM | POA: Diagnosis not present

## 2019-04-08 DIAGNOSIS — B078 Other viral warts: Secondary | ICD-10-CM | POA: Diagnosis not present

## 2019-04-08 DIAGNOSIS — L570 Actinic keratosis: Secondary | ICD-10-CM | POA: Diagnosis not present

## 2019-04-15 DIAGNOSIS — G4731 Primary central sleep apnea: Secondary | ICD-10-CM | POA: Diagnosis not present

## 2019-04-15 DIAGNOSIS — G4733 Obstructive sleep apnea (adult) (pediatric): Secondary | ICD-10-CM | POA: Diagnosis not present

## 2019-04-15 DIAGNOSIS — I10 Essential (primary) hypertension: Secondary | ICD-10-CM | POA: Diagnosis not present

## 2019-04-15 DIAGNOSIS — M76899 Other specified enthesopathies of unspecified lower limb, excluding foot: Secondary | ICD-10-CM | POA: Diagnosis not present

## 2019-05-02 ENCOUNTER — Encounter: Payer: Self-pay | Admitting: Emergency Medicine

## 2019-05-02 ENCOUNTER — Emergency Department: Payer: Medicare HMO

## 2019-05-02 ENCOUNTER — Other Ambulatory Visit: Payer: Self-pay

## 2019-05-02 ENCOUNTER — Emergency Department
Admission: EM | Admit: 2019-05-02 | Discharge: 2019-05-02 | Disposition: A | Discharge status: Home / Self Care | Payer: Medicare HMO | Attending: Emergency Medicine | Admitting: Emergency Medicine

## 2019-05-02 DIAGNOSIS — M25462 Effusion, left knee: Secondary | ICD-10-CM | POA: Insufficient documentation

## 2019-05-02 DIAGNOSIS — Y939 Activity, unspecified: Secondary | ICD-10-CM | POA: Insufficient documentation

## 2019-05-02 DIAGNOSIS — Z79899 Other long term (current) drug therapy: Secondary | ICD-10-CM | POA: Diagnosis not present

## 2019-05-02 DIAGNOSIS — S8992XA Unspecified injury of left lower leg, initial encounter: Secondary | ICD-10-CM

## 2019-05-02 DIAGNOSIS — E119 Type 2 diabetes mellitus without complications: Secondary | ICD-10-CM | POA: Diagnosis not present

## 2019-05-02 DIAGNOSIS — Y999 Unspecified external cause status: Secondary | ICD-10-CM | POA: Insufficient documentation

## 2019-05-02 DIAGNOSIS — Z87891 Personal history of nicotine dependence: Secondary | ICD-10-CM | POA: Insufficient documentation

## 2019-05-02 DIAGNOSIS — Y929 Unspecified place or not applicable: Secondary | ICD-10-CM | POA: Insufficient documentation

## 2019-05-02 DIAGNOSIS — I10 Essential (primary) hypertension: Secondary | ICD-10-CM | POA: Insufficient documentation

## 2019-05-02 DIAGNOSIS — W010XXA Fall on same level from slipping, tripping and stumbling without subsequent striking against object, initial encounter: Secondary | ICD-10-CM | POA: Insufficient documentation

## 2019-05-02 DIAGNOSIS — M11262 Other chondrocalcinosis, left knee: Secondary | ICD-10-CM | POA: Diagnosis not present

## 2019-05-02 DIAGNOSIS — M1712 Unilateral primary osteoarthritis, left knee: Secondary | ICD-10-CM | POA: Diagnosis not present

## 2019-05-02 NOTE — ED Provider Notes (Signed)
[facility] Emergency Department Provider Note  ____________________________________________  Time seen: Approximately 6:59 PM  I have reviewed the triage vital signs and the nursing notes.   HISTORY  Chief Complaint Knee Pain    HPI Gary Wallace is a 68 y.o. male that presents to the emergency department for evaluation of knee pain after a fall on Tuesday.  Patient states that he slipped wearing his flip-flops and bent his knee backwards.  He thought he sprained his knee so he continued to walk on his knee for the last couple of days.  Swelling has increased over the last couple of days.  He has already called Dr. Rudene Christians and Hooten's office and has an appointment scheduled for Wednesday.  No additional injuries.  He has several walkers at home.   Past Medical History:  Diagnosis Date  . Hyperlipemia   . Hypertension     There are no active problems to display for this patient.   Past Surgical History:  Procedure Laterality Date  . PERIPHERAL VASCULAR CATHETERIZATION Right 05/27/2015   Procedure: Upper Extremity Angiography;  Surgeon: Algernon Huxley, MD;  Location: [place] [facility];  Service: Cardiovascular;  Laterality: Right;  . PERIPHERAL VASCULAR CATHETERIZATION Right 05/27/2015   Procedure: Upper Extremity Intervention;  Surgeon: Algernon Huxley, MD;  Location: [place] [facility];  Service: Cardiovascular;  Laterality: Right;    Prior to Admission medications   Medication Sig Start Date End Date Taking? Authorizing Provider  glipiZIDE (GLUCOTROL) 10 MG tablet Take 10 mg by mouth daily before breakfast.   Yes [provider]  aspirin EC 81 MG tablet Take 81 mg by mouth daily.    [provider]  cetirizine (ZYRTEC) 10 MG tablet Take 10 mg by mouth daily.    [provider]  fluticasone (FLONASE) 50 MCG/ACT nasal spray Place 2 sprays into both nostrils daily.    [provider]  lisinopril (PRINIVIL,ZESTRIL) 20 MG  tablet Take 20 mg by mouth daily. 07/17/16   [provider]  Multiple Vitamins-Minerals (MULTIVITAMIN WITH MINERALS) tablet Take 1 tablet by mouth daily.    [provider]  Omega-3 Fatty Acids (FISH OIL) 435 MG CAPS Take 1 capsule by mouth.    [provider]  pantoprazole (PROTONIX) 20 MG tablet Take 20 mg by mouth daily.    [provider]  simvastatin (ZOCOR) 20 MG tablet Take 20 mg by mouth daily.    [provider]    Allergies Patient has no known allergies.  No family history on file.  Social History Social History   Tobacco Use  . Smoking status: Former Smoker    Packs/day: 1.00    Years: 30.00    Pack years: 30.00    Quit date: 05/26/1998    Years since quitting: 20.9  . Smokeless tobacco: Never Used  Substance Use Topics  . Alcohol use: No  . Drug use: No     Review of Systems  Gastrointestinal: No abdominal pain.  No nausea, no vomiting.  Musculoskeletal: Positive for knee pain. Skin: Negative for rash, abrasions, lacerations, ecchymosis. Neurological: Negative for numbness or tingling   ____________________________________________   PHYSICAL EXAM:  VITAL SIGNS: ED Triage Vitals  Enc Vitals Group     BP 05/02/19 1744 139/87     Pulse Rate 05/02/19 1744 85     Resp 05/02/19 1744 16     Temp 05/02/19 1744 98.3 F (36.8 C)     Temp Source 05/02/19  1744 Oral     SpO2 05/02/19 1744 99 %     Weight 05/02/19 1732 212 lb 1.3 oz (96.2 kg)     Height --      Head Circumference --      Peak Flow --      Pain Score 05/02/19 1732 7     Pain Loc --      Pain Edu? --      Excl. in [place]? --      Constitutional: Alert and oriented. Well appearing and in no acute distress. Eyes: Conjunctivae are normal. PERRL. EOMI. Head: Atraumatic. ENT:      Ears:      Nose: No congestion/rhinnorhea.      Mouth/Throat: Mucous membranes are moist.  Neck: No stridor. Cardiovascular: Normal rate, regular rhythm.  Good peripheral  circulation. Respiratory: Normal respiratory effort without tachypnea or retractions. Lungs CTAB. Good air entry to the bases with no decreased or absent breath sounds. Musculoskeletal: Full range of motion to all extremities. No gross deformities appreciated.  Mild swelling to left knee.  Full range of motion of left knee without pain.  No overlying erythema or ecchymosis.  Palpable dorsalis pedis pulses. Neurologic:  Normal speech and language. No gross focal neurologic deficits are appreciated.  Skin:  Skin is warm, dry and intact. No rash noted. Psychiatric: Mood and affect are normal. Speech and behavior are normal. Patient exhibits appropriate insight and judgement.   ____________________________________________   LABS (all labs ordered are listed, but only abnormal results are displayed)  Labs Reviewed - No data to display ____________________________________________  EKG   ____________________________________________  RADIOLOGY Robinette Haines, personally viewed and evaluated these images (plain radiographs) as part of my medical decision making, as well as reviewing the written report by the radiologist.  Dg Knee Complete 4 Views Left  Result Date: 05/02/2019 CLINICAL DATA:  Left knee pain since a fall 2 days ago. EXAM: LEFT KNEE - COMPLETE 4+ VIEW COMPARISON:  None. FINDINGS: There is no acute bony or joint abnormality. Small joint effusion is noted. Joint spaces are preserved. Chondrocalcinosis is seen about the knee. Mild osteophytosis is present. IMPRESSION: Negative for fracture. Small joint effusion. Chondrocalcinosis. Mild osteoarthritis. Electronically Signed   By: Inge Rise M.D.   On: 05/02/2019 18:18    ____________________________________________    PROCEDURES  Procedure(s) performed:    Procedures    Medications - No data to display   ____________________________________________   INITIAL IMPRESSION / ASSESSMENT AND PLAN / ED  COURSE  Pertinent labs & imaging results that were available during my care of the patient were reviewed by me and considered in my medical decision making (see chart for details).  Review of the [place] CSRS was performed in accordance of the [place] prior to dispensing any controlled drugs.     Patient presents emergency department for evaluation of knee pain after injury 4 days ago.  Vital signs and exam are reassuring.  X-ray negative for fracture and consistent with small joint effusion.  Knee immobilizer was placed.  Patient has walkers at home.   Patient is to follow up with orthopedics as directed.  He already has an appointment scheduled for Wednesday.  Patient is given ED precautions to return to the ED for any worsening or new symptoms.  PHI AVANS was evaluated in Emergency Department on 05/02/2019 for the symptoms described in the history of present illness. He was evaluated in the context of the global COVID-19 pandemic, which necessitated consideration  that the patient might be at risk for infection with the SARS-CoV-2 virus that causes COVID-19. Institutional protocols and algorithms that pertain to the evaluation of patients at risk for COVID-19 are in a state of rapid change based on information released by regulatory bodies including the CDC and federal and state organizations. These policies and algorithms were followed during the patient's care in the ED.   ____________________________________________  FINAL CLINICAL IMPRESSION(S) / ED DIAGNOSES  Final diagnoses:  Injury of left knee, initial encounter  Effusion of left knee      NEW MEDICATIONS STARTED DURING THIS VISIT:  ED Discharge Orders    None          This chart was dictated using voice recognition software/Dragon. Despite best efforts to proofread, errors can occur which can change the meaning. Any change was purely unintentional.    Laban Emperor, PA-C 05/02/19 2039    Carrie Mew,  MD 05/07/19 1550

## 2019-05-02 NOTE — ED Notes (Signed)

## 2019-05-02 NOTE — ED Notes (Signed)
See triage note  Presents s/p fall  States he slipped on weds afternoon  Twisted ankle back and bent back his knee  States he was able to get up and was able to walk on it weds and thurs  Today having more pain with some swelling

## 2019-05-02 NOTE — ED Triage Notes (Signed)
Slipped and injured left knee today.

## 2019-05-07 DIAGNOSIS — M25562 Pain in left knee: Secondary | ICD-10-CM | POA: Diagnosis not present

## 2019-05-07 DIAGNOSIS — M2392 Unspecified internal derangement of left knee: Secondary | ICD-10-CM | POA: Diagnosis not present

## 2019-05-07 DIAGNOSIS — M1712 Unilateral primary osteoarthritis, left knee: Secondary | ICD-10-CM | POA: Diagnosis not present

## 2019-05-07 DIAGNOSIS — M25462 Effusion, left knee: Secondary | ICD-10-CM | POA: Diagnosis not present

## 2019-05-28 DIAGNOSIS — M25562 Pain in left knee: Secondary | ICD-10-CM | POA: Diagnosis not present

## 2019-05-28 DIAGNOSIS — M25462 Effusion, left knee: Secondary | ICD-10-CM | POA: Diagnosis not present

## 2019-05-28 DIAGNOSIS — M1712 Unilateral primary osteoarthritis, left knee: Secondary | ICD-10-CM | POA: Diagnosis not present

## 2019-05-28 DIAGNOSIS — M2392 Unspecified internal derangement of left knee: Secondary | ICD-10-CM | POA: Diagnosis not present

## 2019-06-23 DIAGNOSIS — Z01812 Encounter for preprocedural laboratory examination: Secondary | ICD-10-CM | POA: Diagnosis not present

## 2019-06-23 DIAGNOSIS — Z1211 Encounter for screening for malignant neoplasm of colon: Secondary | ICD-10-CM | POA: Diagnosis not present

## 2019-06-23 DIAGNOSIS — K760 Fatty (change of) liver, not elsewhere classified: Secondary | ICD-10-CM | POA: Diagnosis not present

## 2019-06-23 DIAGNOSIS — K219 Gastro-esophageal reflux disease without esophagitis: Secondary | ICD-10-CM | POA: Diagnosis not present

## 2019-06-23 DIAGNOSIS — R1310 Dysphagia, unspecified: Secondary | ICD-10-CM | POA: Diagnosis not present

## 2019-06-24 DIAGNOSIS — G4733 Obstructive sleep apnea (adult) (pediatric): Secondary | ICD-10-CM | POA: Diagnosis not present

## 2019-06-25 DIAGNOSIS — G8929 Other chronic pain: Secondary | ICD-10-CM | POA: Diagnosis not present

## 2019-06-25 DIAGNOSIS — M1712 Unilateral primary osteoarthritis, left knee: Secondary | ICD-10-CM | POA: Diagnosis not present

## 2019-06-25 DIAGNOSIS — M25462 Effusion, left knee: Secondary | ICD-10-CM | POA: Diagnosis not present

## 2019-06-25 DIAGNOSIS — M2392 Unspecified internal derangement of left knee: Secondary | ICD-10-CM | POA: Diagnosis not present

## 2019-06-25 DIAGNOSIS — M25562 Pain in left knee: Secondary | ICD-10-CM | POA: Diagnosis not present

## 2019-07-21 DIAGNOSIS — T63424A Toxic effect of venom of ants, undetermined, initial encounter: Secondary | ICD-10-CM | POA: Diagnosis not present

## 2019-08-13 DIAGNOSIS — Z01812 Encounter for preprocedural laboratory examination: Secondary | ICD-10-CM | POA: Diagnosis not present

## 2019-08-13 DIAGNOSIS — Z01818 Encounter for other preprocedural examination: Secondary | ICD-10-CM | POA: Diagnosis not present

## 2019-08-19 DIAGNOSIS — G4733 Obstructive sleep apnea (adult) (pediatric): Secondary | ICD-10-CM | POA: Diagnosis not present

## 2019-08-19 DIAGNOSIS — D123 Benign neoplasm of transverse colon: Secondary | ICD-10-CM | POA: Diagnosis not present

## 2019-08-19 DIAGNOSIS — K449 Diaphragmatic hernia without obstruction or gangrene: Secondary | ICD-10-CM | POA: Diagnosis not present

## 2019-08-19 DIAGNOSIS — K2970 Gastritis, unspecified, without bleeding: Secondary | ICD-10-CM | POA: Diagnosis not present

## 2019-08-19 DIAGNOSIS — R1310 Dysphagia, unspecified: Secondary | ICD-10-CM | POA: Diagnosis not present

## 2019-08-19 DIAGNOSIS — Z1211 Encounter for screening for malignant neoplasm of colon: Secondary | ICD-10-CM | POA: Diagnosis not present

## 2019-08-19 DIAGNOSIS — E119 Type 2 diabetes mellitus without complications: Secondary | ICD-10-CM | POA: Diagnosis not present

## 2019-08-19 DIAGNOSIS — K219 Gastro-esophageal reflux disease without esophagitis: Secondary | ICD-10-CM | POA: Diagnosis not present

## 2019-08-19 DIAGNOSIS — K635 Polyp of colon: Secondary | ICD-10-CM | POA: Diagnosis not present

## 2019-08-19 DIAGNOSIS — E669 Obesity, unspecified: Secondary | ICD-10-CM | POA: Diagnosis not present

## 2019-08-19 DIAGNOSIS — I10 Essential (primary) hypertension: Secondary | ICD-10-CM | POA: Diagnosis not present

## 2019-08-19 DIAGNOSIS — K640 First degree hemorrhoids: Secondary | ICD-10-CM | POA: Diagnosis not present

## 2019-09-09 DIAGNOSIS — R69 Illness, unspecified: Secondary | ICD-10-CM | POA: Diagnosis not present

## 2019-09-22 ENCOUNTER — Other Ambulatory Visit: Payer: Self-pay

## 2019-09-22 DIAGNOSIS — Z20822 Contact with and (suspected) exposure to covid-19: Secondary | ICD-10-CM

## 2019-09-23 LAB — NOVEL CORONAVIRUS, NAA: SARS-CoV-2, NAA: DETECTED — AB

## 2019-09-24 ENCOUNTER — Telehealth: Payer: Self-pay | Admitting: Unknown Physician Specialty

## 2019-09-24 NOTE — Telephone Encounter (Signed)
Discussed with patient about Covid symptoms and the use of bamlanivimab, a monoclonal antibody infusion for those with mild to moderate Covid symptoms and at a high risk of hospitalization.  Pt is qualified for this infusion at the [facility] infusion center due to age >85 or  co-morbid conditions.    Pt has no symptoms and does not qualify for treatment

## 2019-09-25 DIAGNOSIS — G4733 Obstructive sleep apnea (adult) (pediatric): Secondary | ICD-10-CM | POA: Diagnosis not present

## 2019-11-03 DIAGNOSIS — Z961 Presence of intraocular lens: Secondary | ICD-10-CM | POA: Diagnosis not present

## 2019-11-03 DIAGNOSIS — E113393 Type 2 diabetes mellitus with moderate nonproliferative diabetic retinopathy without macular edema, bilateral: Secondary | ICD-10-CM | POA: Diagnosis not present

## 2019-12-22 ENCOUNTER — Other Ambulatory Visit: Payer: Self-pay | Admitting: Gastroenterology

## 2019-12-22 DIAGNOSIS — K760 Fatty (change of) liver, not elsewhere classified: Secondary | ICD-10-CM | POA: Diagnosis not present

## 2019-12-22 DIAGNOSIS — K222 Esophageal obstruction: Secondary | ICD-10-CM | POA: Diagnosis not present

## 2019-12-22 DIAGNOSIS — K219 Gastro-esophageal reflux disease without esophagitis: Secondary | ICD-10-CM | POA: Diagnosis not present

## 2019-12-22 DIAGNOSIS — R1319 Other dysphagia: Secondary | ICD-10-CM

## 2019-12-22 DIAGNOSIS — R1310 Dysphagia, unspecified: Secondary | ICD-10-CM | POA: Diagnosis not present

## 2019-12-22 DIAGNOSIS — E119 Type 2 diabetes mellitus without complications: Secondary | ICD-10-CM | POA: Diagnosis not present

## 2019-12-29 ENCOUNTER — Other Ambulatory Visit: Payer: Self-pay

## 2019-12-29 ENCOUNTER — Ambulatory Visit
Admission: RE | Admit: 2019-12-29 | Discharge: 2019-12-29 | Disposition: A | Discharge status: Home / Self Care | Payer: Medicare HMO | Source: Ambulatory Visit | Attending: Gastroenterology | Admitting: Gastroenterology

## 2019-12-29 DIAGNOSIS — R1310 Dysphagia, unspecified: Secondary | ICD-10-CM | POA: Diagnosis not present

## 2019-12-29 DIAGNOSIS — R1319 Other dysphagia: Secondary | ICD-10-CM

## 2019-12-29 DIAGNOSIS — K219 Gastro-esophageal reflux disease without esophagitis: Secondary | ICD-10-CM | POA: Diagnosis not present

## 2020-03-10 DIAGNOSIS — R69 Illness, unspecified: Secondary | ICD-10-CM | POA: Diagnosis not present

## 2020-03-12 ENCOUNTER — Ambulatory Visit (INDEPENDENT_AMBULATORY_CARE_PROVIDER_SITE_OTHER): Payer: Medicare HMO | Admitting: Internal Medicine

## 2020-03-12 ENCOUNTER — Encounter: Payer: Self-pay | Admitting: Internal Medicine

## 2020-03-12 ENCOUNTER — Other Ambulatory Visit: Payer: Self-pay

## 2020-03-12 VITALS — BP 149/96 | HR 64 | Wt 202.6 lb

## 2020-03-12 DIAGNOSIS — E119 Type 2 diabetes mellitus without complications: Secondary | ICD-10-CM

## 2020-03-12 DIAGNOSIS — I10 Essential (primary) hypertension: Secondary | ICD-10-CM | POA: Diagnosis not present

## 2020-03-12 DIAGNOSIS — E7849 Other hyperlipidemia: Secondary | ICD-10-CM

## 2020-03-12 DIAGNOSIS — Z0000 Encounter for general adult medical examination without abnormal findings: Secondary | ICD-10-CM | POA: Diagnosis not present

## 2020-03-12 LAB — POCT GLYCOSYLATED HEMOGLOBIN (HGB A1C): Hemoglobin A1C: 6.1 % — AB (ref 4.0–5.6)

## 2020-03-12 NOTE — Addendum Note (Signed)
Addended by: Alois Cliche on: 03/12/2020 02:46 PM   Modules accepted: Orders

## 2020-03-12 NOTE — Progress Notes (Signed)
Established Patient Office Visit  Subjective:  Patient ID: Gary Wallace, male    DOB: Aug 26, 1951  Age: 69 y.o. MRN: [id number]  CC:  Chief Complaint  Patient presents with  . Annual Exam    Cough This is a recurrent problem. The problem has been waxing and waning. Associated symptoms include rhinorrhea. Pertinent negatives include no chest pain, chills, ear pain or fever. The symptoms are aggravated by dust. Risk factors for lung disease include smoking/tobacco exposure. He has tried steroid inhaler for the symptoms. The treatment provided mild relief.    Past Medical History:  Diagnosis Date  . Hyperlipemia   . Hypertension     Past Surgical History:  Procedure Laterality Date  . PERIPHERAL VASCULAR CATHETERIZATION Right 05/27/2015   Procedure: Upper Extremity Angiography;  Surgeon: Algernon Huxley, MD;  Location: [place] [facility];  Service: Cardiovascular;  Laterality: Right;  . PERIPHERAL VASCULAR CATHETERIZATION Right 05/27/2015   Procedure: Upper Extremity Intervention;  Surgeon: Algernon Huxley, MD;  Location: [place] [facility];  Service: Cardiovascular;  Laterality: Right;    History reviewed. No pertinent family history.  Social History   Socioeconomic History  . Marital status: Married    Spouse name: Not on file  . Number of children: Not on file  . Years of education: Not on file  . Highest education level: Not on file  Occupational History  . Not on file  Tobacco Use  . Smoking status: Former Smoker    Packs/day: 1.00    Years: 30.00    Pack years: 30.00    Quit date: 05/26/1998    Years since quitting: 21.8  . Smokeless tobacco: Never Used  Substance and Sexual Activity  . Alcohol use: No  . Drug use: No  . Sexual activity: Not on file  Other Topics Concern  . Not on file  Social History Narrative  . Not on file   Social Determinants of Health   Financial Resource Strain:   . Difficulty of Paying Living Expenses:   Food Insecurity:   .  Worried About Charity fundraiser in the Last Year:   . Arboriculturist in the Last Year:   Transportation Needs:   . Film/video editor (Medical):   Marland Kitchen Lack of Transportation (Non-Medical):   Physical Activity:   . Days of Exercise per Week:   . Minutes of Exercise per Session:   Stress:   . Feeling of Stress :   Social Connections:   . Frequency of Communication with Friends and Family:   . Frequency of Social Gatherings with Friends and Family:   . Attends Religious Services:   . Active Member of Clubs or Organizations:   . Attends Archivist Meetings:   Marland Kitchen Marital Status:   Intimate Partner Violence:   . Fear of Current or Ex-Partner:   . Emotionally Abused:   Marland Kitchen Physically Abused:   . Sexually Abused:      Current Outpatient Medications:  .  aspirin EC 81 MG tablet, Take 81 mg by mouth daily., Disp: , Rfl:  .  cetirizine (ZYRTEC) 10 MG tablet, Take 10 mg by mouth daily., Disp: , Rfl:  .  fluticasone (FLONASE) 50 MCG/ACT nasal spray, Place 2 sprays into both nostrils daily., Disp: , Rfl:  .  glipiZIDE (GLUCOTROL) 10 MG tablet, Take 10 mg by mouth daily before breakfast., Disp: , Rfl:  .  lisinopril (PRINIVIL,ZESTRIL) 20 MG tablet, Take 20 mg by mouth  daily., Disp: , Rfl: 4 .  Multiple Vitamins-Minerals (MULTIVITAMIN WITH MINERALS) tablet, Take 1 tablet by mouth daily., Disp: , Rfl:  .  pantoprazole (PROTONIX) 20 MG tablet, Take 20 mg by mouth daily., Disp: , Rfl:  .  simvastatin (ZOCOR) 20 MG tablet, Take 20 mg by mouth daily., Disp: , Rfl:   Current Facility-Administered Medications:  .  betamethasone acetate-betamethasone sodium phosphate (CELESTONE) injection 3 mg, 3 mg, Intramuscular, Once, Evans, Brent M, DPM   No Known Allergies  ROS Review of Systems  Constitutional: Negative for chills and fever.  HENT: Positive for rhinorrhea. Negative for ear pain.   Respiratory: Positive for cough.   Cardiovascular: Negative for chest pain.  Gastrointestinal:  Negative for abdominal distention.  Endocrine: Negative for polydipsia.  Genitourinary: Negative for hematuria.  Neurological: Negative.  Negative for light-headedness.  Hematological: Negative.   Psychiatric/Behavioral: Negative.       Objective:    Physical Exam  Constitutional: He is oriented to person, place, and time. He appears well-developed.  HENT:  Head: Normocephalic.  Eyes: Pupils are equal, round, and reactive to light.  Neck: No JVD present. No thyromegaly present.  Cardiovascular: Exam reveals no friction rub.  Pulmonary/Chest: He has no wheezes.  Abdominal: He exhibits no mass. There is no abdominal tenderness. There is no guarding. A hernia is present. Hernia confirmed positive in the right inguinal area.  Genitourinary:    Testes normal.  Rectum:     Guaiac result negative.     No anal fissure, tenderness, external hemorrhoid or internal hemorrhoid.  Prostate is tender. Right testis shows no mass. Left testis shows no mass. No penile tenderness.    Genitourinary Comments: Patient has normal genitalia  prostate is normal little bit firm  no blood was noted in the rectal area and the sphincter tone is normal.   Testicles are bilaterally descended.  No lymphadenopathy was noted.   Musculoskeletal:     Cervical back: Normal range of motion and neck supple.  Lymphadenopathy:    He has no cervical adenopathy.       Right: No inguinal adenopathy present.       Left: No inguinal adenopathy present.  Neurological: He is alert and oriented to person, place, and time. No cranial nerve deficit. Coordination normal.  Skin: Skin is warm.    BP (!) 149/96   Pulse 64   Wt 202 lb 9.6 oz (91.9 kg)   BMI 30.81 kg/m  Wt Readings from Last 3 Encounters:  03/12/20 202 lb 9.6 oz (91.9 kg)  05/02/19 212 lb 1.3 oz (96.2 kg)  05/27/15 212 lb (96.2 kg)     Health Maintenance Due  Topic Date Due  . Hepatitis C Screening  Never done  . COVID-19 Vaccine (1) Never done  .  TETANUS/TDAP  Never done  . COLONOSCOPY  Never done  . PNA vac Low Risk Adult (1 of 2 - PCV13) Never done    There are no preventive care reminders to display for this patient.  No results found for: TSH No results found for: WBC, HGB, HCT, MCV, PLT Lab Results  Component Value Date   NA 138 05/27/2015   K 4.2 05/27/2015   CO2 25 05/27/2015   GLUCOSE 125 (H) 05/27/2015   BUN 15 05/27/2015   CREATININE 0.88 05/27/2015   CALCIUM 9.3 05/27/2015   ANIONGAP 8 05/27/2015   No results found for: CHOL No results found for: HDL No results found for: LDLCALC No results found for:  TRIG No results found for: CHOLHDL No results found for: HGBA1C    Assessment & Plan:   Problem List Items Addressed This Visit    None    Visit Diagnoses    Type 2 diabetes mellitus without complication, without long-term current use of insulin ([place])    -  Primary   Relevant Orders   Hemoglobin A1c   TSH   Microalbumin, urine   Annual physical exam       Relevant Orders   CBC with Differential/Platelet   Hypertension, unspecified type       Relevant Orders   COMPLETE METABOLIC PANEL WITH GFR   Hyperlipemia, fat-induced       Relevant Orders   Lipid Panel With LDL/HDL Ratio      No orders of the defined types were placed in this encounter. 1. Type 2 diabetes mellitus without complication, without long-term current use of insulin ([facility]) Stable. - Hemoglobin A1c - TSH - Microalbumin, urine  2. Annual physical exam Normal. - CBC with Differential/Platelet  3. Hypertension, unspecified type Blood pressure is okay today - COMPLETE METABOLIC PANEL WITH GFR  4. Hyperlipemia, fat-induced Patient patient does not take statin, because of the muscle ache myalgia and pain in the both legs. - Lipid Panel With LDL/HDL Ratio Follow-up: Return in about 3 months (around 06/12/2020).    Cletis Athens, MD

## 2020-03-12 NOTE — Addendum Note (Signed)
Addended by: Alois Cliche on: 03/12/2020 02:47 PM   Modules accepted: Orders

## 2020-03-15 LAB — COMPLETE METABOLIC PANEL WITH GFR
AG Ratio: 1.5 (calc) (ref 1.0–2.5)
ALT: 37 U/L (ref 9–46)
AST: 46 U/L — ABNORMAL HIGH (ref 10–35)
Albumin: 4.4 g/dL (ref 3.6–5.1)
Alkaline phosphatase (APISO): 54 U/L (ref 35–144)
BUN: 15 mg/dL (ref 7–25)
CO2: 20 mmol/L (ref 20–32)
Calcium: 9.4 mg/dL (ref 8.6–10.3)
Chloride: 99 mmol/L (ref 98–110)
Creat: 1 mg/dL (ref 0.70–1.25)
GFR, Est African American: 90 mL/min/{1.73_m2} (ref >=60)
GFR, Est Non African American: 78 mL/min/{1.73_m2} (ref >=60)
Globulin: 2.9 g/dL (calc) (ref 1.9–3.7)
Glucose, Bld: 196 mg/dL — ABNORMAL HIGH (ref 65–99)
Potassium: 4.8 mmol/L (ref 3.5–5.3)
Sodium: 137 mmol/L (ref 135–146)
Total Bilirubin: 0.8 mg/dL (ref 0.2–1.2)
Total Protein: 7.3 g/dL (ref 6.1–8.1)

## 2020-03-15 LAB — HEMOGLOBIN A1C

## 2020-03-15 LAB — MICROALBUMIN, URINE: Microalb, Ur: 0.5 mg/dL

## 2020-03-15 LAB — CBC WITH DIFFERENTIAL/PLATELET

## 2020-03-15 LAB — TSH: TSH: 1.49 mIU/L (ref 0.40–4.50)

## 2020-03-16 ENCOUNTER — Telehealth: Payer: Self-pay

## 2020-03-16 NOTE — Telephone Encounter (Signed)
Letter given to patient stating his A1C was within normal range.

## 2020-04-02 ENCOUNTER — Other Ambulatory Visit: Payer: Self-pay | Admitting: *Deleted

## 2020-04-02 MED ORDER — GLIPIZIDE 10 MG PO TABS: 10.0000 mg | ORAL_TABLET | Freq: Every day | ORAL | 6 refills | Status: DC

## 2020-04-06 ENCOUNTER — Other Ambulatory Visit: Payer: Self-pay | Admitting: *Deleted

## 2020-04-06 MED ORDER — GLIPIZIDE 10 MG PO TABS: 10.0000 mg | ORAL_TABLET | Freq: Every day | ORAL | 6 refills | Status: DC

## 2020-04-12 DIAGNOSIS — D225 Melanocytic nevi of trunk: Secondary | ICD-10-CM | POA: Diagnosis not present

## 2020-04-12 DIAGNOSIS — X32XXXA Exposure to sunlight, initial encounter: Secondary | ICD-10-CM | POA: Diagnosis not present

## 2020-04-12 DIAGNOSIS — D2271 Melanocytic nevi of right lower limb, including hip: Secondary | ICD-10-CM | POA: Diagnosis not present

## 2020-04-12 DIAGNOSIS — L570 Actinic keratosis: Secondary | ICD-10-CM | POA: Diagnosis not present

## 2020-04-12 DIAGNOSIS — D044 Carcinoma in situ of skin of scalp and neck: Secondary | ICD-10-CM | POA: Diagnosis not present

## 2020-04-12 DIAGNOSIS — D2261 Melanocytic nevi of right upper limb, including shoulder: Secondary | ICD-10-CM | POA: Diagnosis not present

## 2020-04-12 DIAGNOSIS — D485 Neoplasm of uncertain behavior of skin: Secondary | ICD-10-CM | POA: Diagnosis not present

## 2020-04-12 DIAGNOSIS — L821 Other seborrheic keratosis: Secondary | ICD-10-CM | POA: Diagnosis not present

## 2020-04-12 DIAGNOSIS — D2262 Melanocytic nevi of left upper limb, including shoulder: Secondary | ICD-10-CM | POA: Diagnosis not present

## 2020-04-12 DIAGNOSIS — D2272 Melanocytic nevi of left lower limb, including hip: Secondary | ICD-10-CM | POA: Diagnosis not present

## 2020-04-14 DIAGNOSIS — G4733 Obstructive sleep apnea (adult) (pediatric): Secondary | ICD-10-CM | POA: Diagnosis not present

## 2020-04-24 ENCOUNTER — Other Ambulatory Visit: Payer: Self-pay

## 2020-04-24 MED ORDER — LISINOPRIL 20 MG PO TABS: 20.0000 mg | ORAL_TABLET | Freq: Every day | ORAL | 2 refills | Status: DC

## 2020-04-27 DIAGNOSIS — E113393 Type 2 diabetes mellitus with moderate nonproliferative diabetic retinopathy without macular edema, bilateral: Secondary | ICD-10-CM | POA: Diagnosis not present

## 2020-05-05 DIAGNOSIS — D044 Carcinoma in situ of skin of scalp and neck: Secondary | ICD-10-CM | POA: Diagnosis not present

## 2020-05-07 IMAGING — RF DG ESOPHAGUS
10 of 11 series · 14 of 21 positions shown · non-contrast
Comparison: None.

CLINICAL DATA: Dysphagia. History of esophageal dilations.

EXAM:
ESOPHOGRAM / BARIUM SWALLOW / BARIUM TABLET STUDY
TECHNIQUE: Combined double contrast and single contrast examination performed
using effervescent crystals, thick barium liquid, and thin barium
liquid. The patient was observed with fluoroscopy swallowing a 13 mm
barium sulphate tablet.
FLUOROSCOPY TIME:  Fluoroscopy Time:  48 seconds
Radiation Exposure Index (if provided by the fluoroscopic device):
33.3 mGy
Number of Acquired Spot Images: 15

[Series 1: fluoro_barium 2fps_bw · 0.18mm/px · 2 of 3 frames shown (1 of 7)]
[frame 1/3]
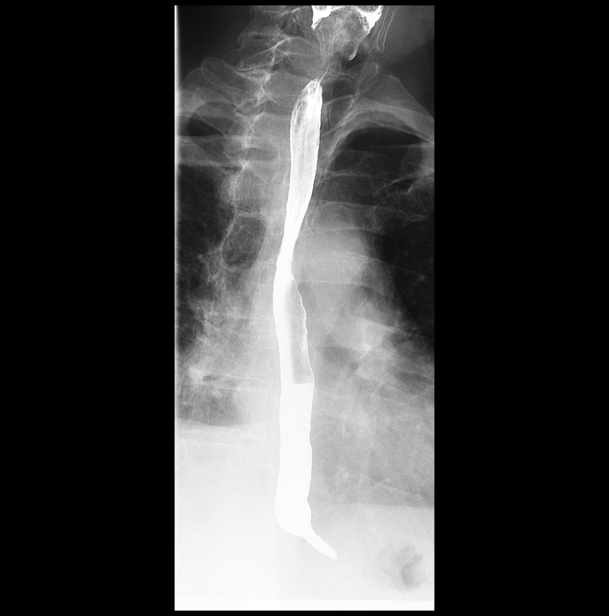
[frame 3/3]
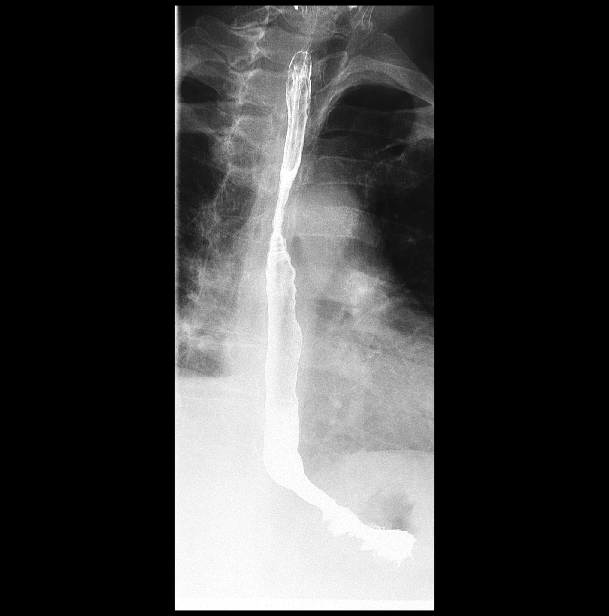

[Series 2: fluoro_barium 2fps_bw · 0.18mm/px · 1 of 2 frames shown (2 of 7)]
[frame 1/2]
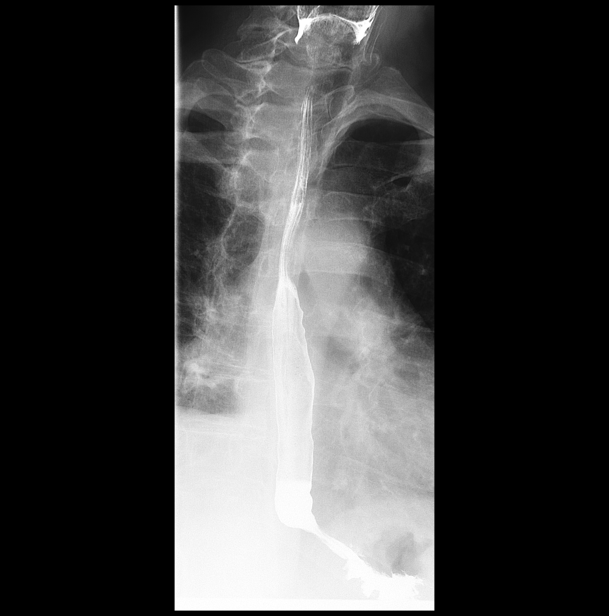

[Series 3: fluoro_barium 2fps_bw · 0.18mm/px · 3 of 4 frames shown (3 of 7)]
[frame 1/4]
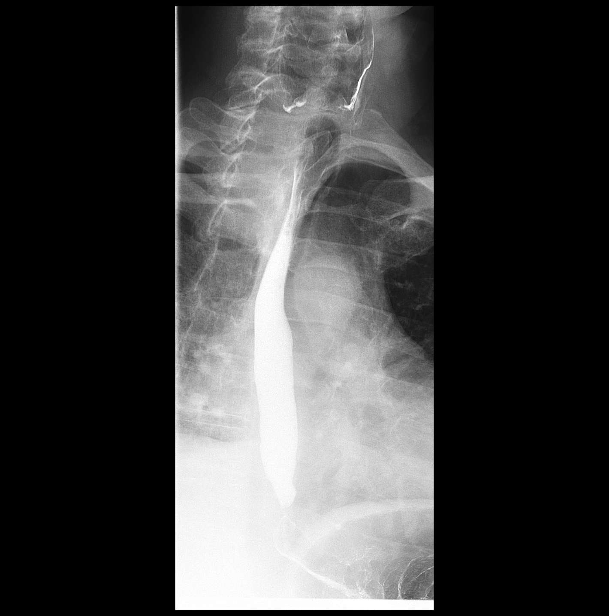
[frame 2/4]
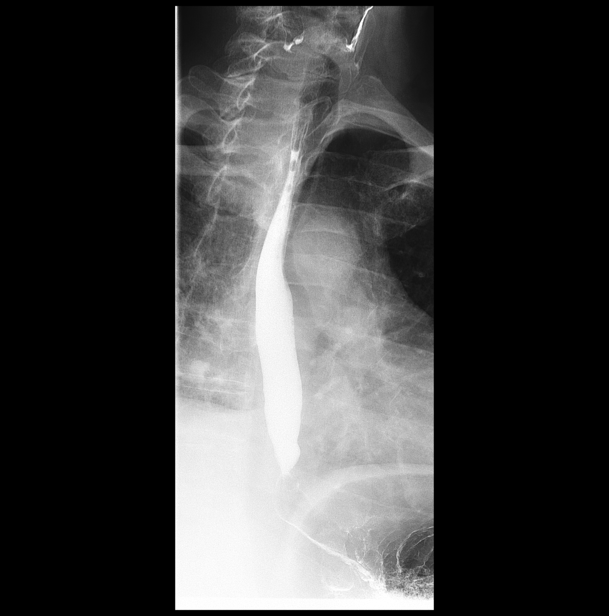
[frame 4/4]
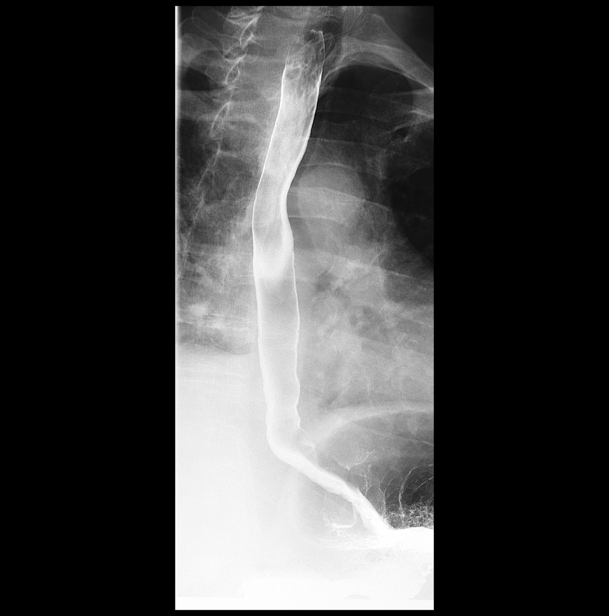

[Series 4: fluoro_barium 2fps_bw · 0.18mm/px · 1 of 1 slices shown (4 of 7)]
[im 1/1]
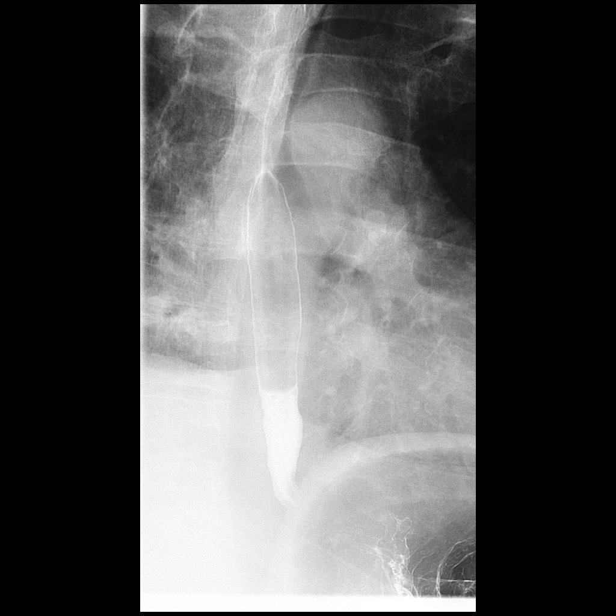

[Series 5: fluoro_barium 2fps_bw · 0.18mm/px · 2 of 5 frames shown (5 of 7)]
[frame 3/5]
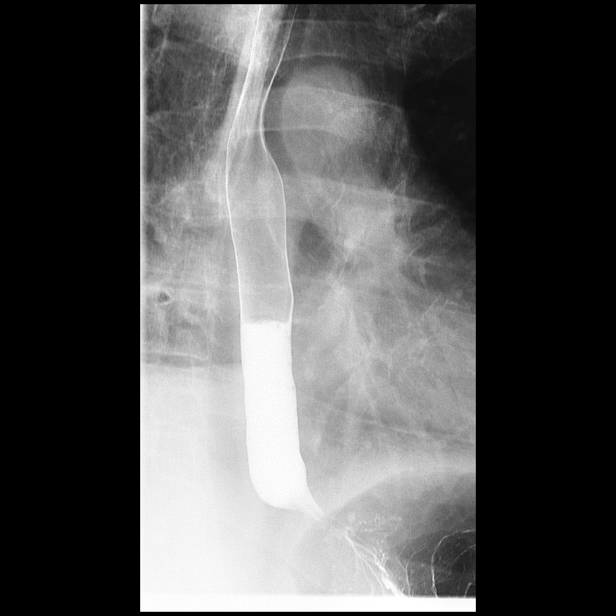
[frame 5/5]
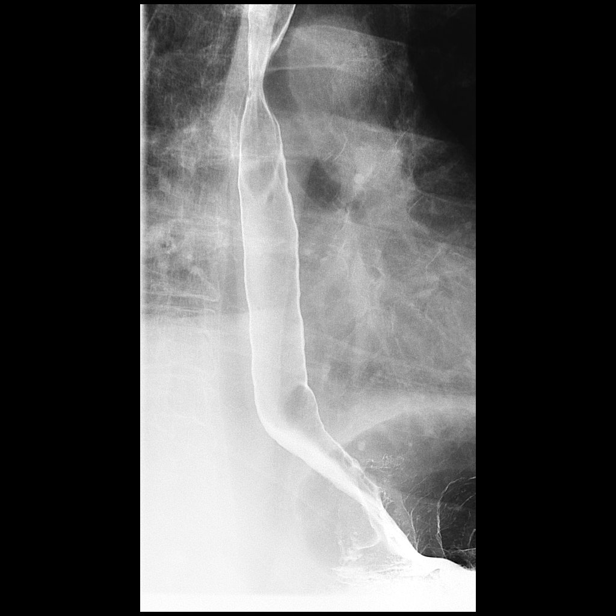

[Series 6: fluoro_barium 2fps_bw · 0.18mm/px · 1 of 2 frames shown (6 of 7)]
[frame 2/2]
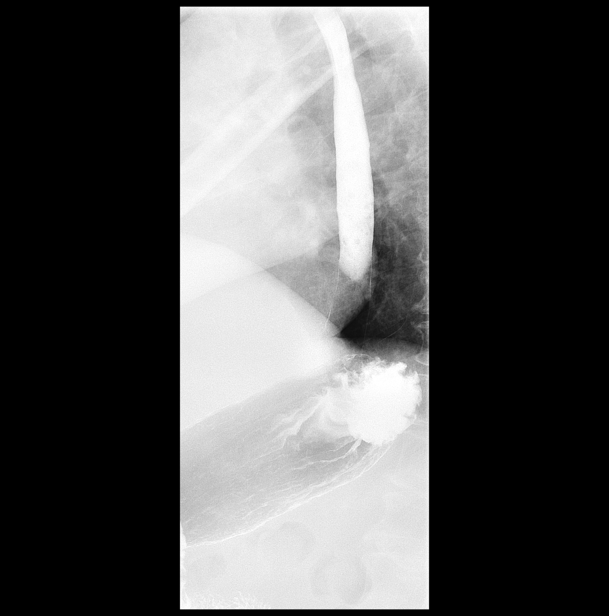

[Series 7: fluoro_barium 2fps_bw · 0.18mm/px · 1 of 2 frames shown (7 of 7)]
[frame 1/2]
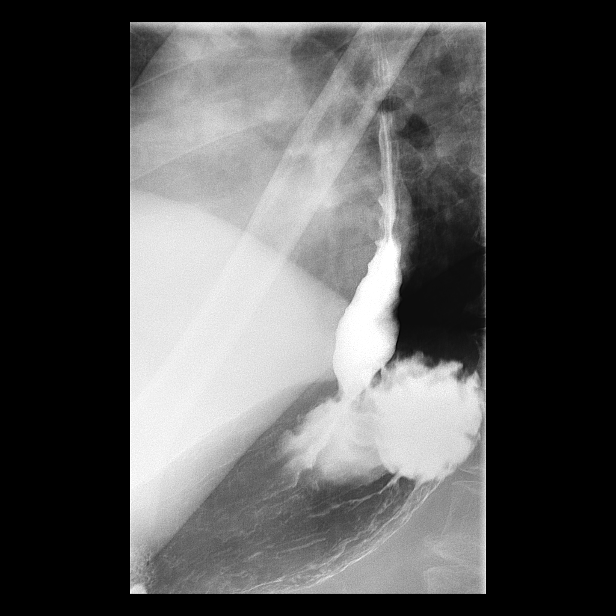

[Series 8: cp_standard · 0.18mm/px · 1 of 1 slices shown (1 of 3)]
[im 1/1]
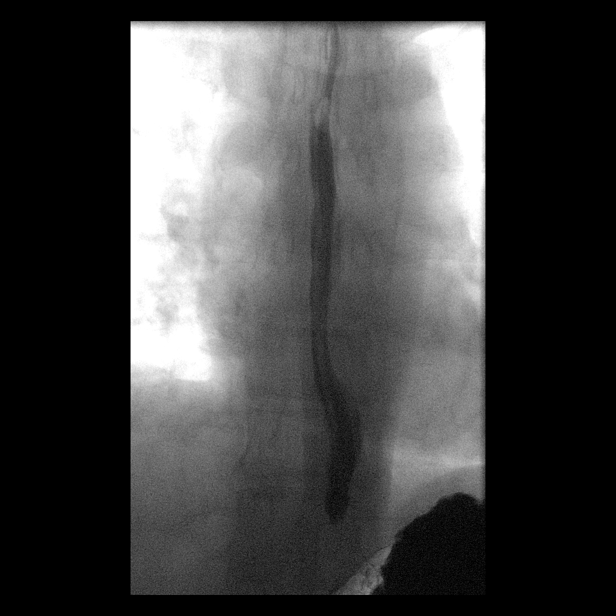

[Series 9: cp_standard · 0.18mm/px · 1 of 1 slices shown (2 of 3)]
[im 1/1]
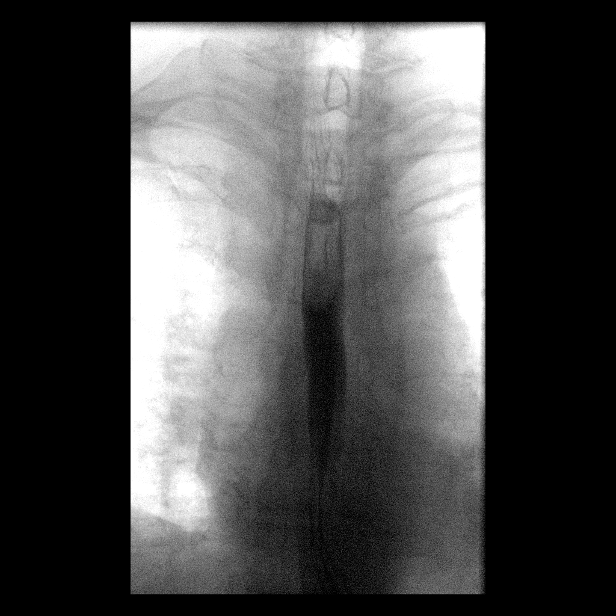

[Series 11: cp_standard · 0.18mm/px · 1 of 1 slices shown (3 of 3)]
[im 1/1]
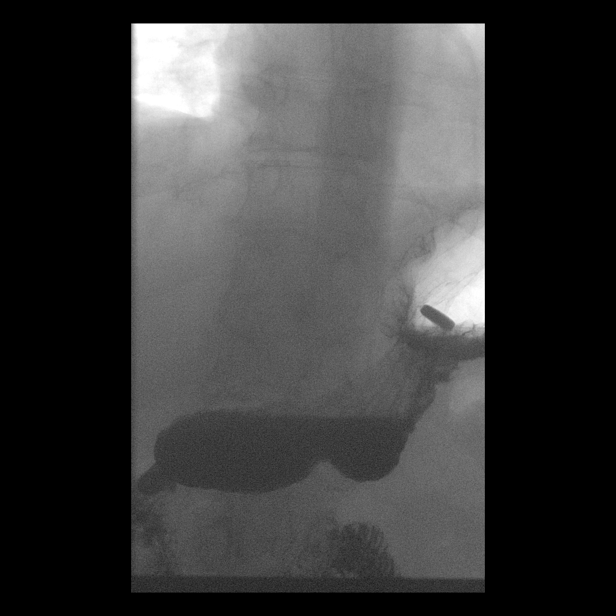

[14 of 21 positions shown; findings below may reference images not displayed]

FINDINGS: The esophageal motility is within normal limits. There is no
evidence of stricture, mass or ulceration. Rapid sequence imaging of
the esophagus in the AP and lateral projections demonstrates no
mucosal abnormalities. Gastroesophageal junction is appropriately
located without hiatal hernia.

Moderate elicited gastroesophageal reflux to the level of the
thoracic inlet. At the conclusion of the study, a 13 mm barium
tablet was administered. This passed without delay into the stomach.
IMPRESSION: 1. Moderate gastroesophageal reflux to the level of the thoracic
inlet.
2. Otherwise unremarkable esophagram.  No persisting stricture.

## 2020-05-10 DIAGNOSIS — G4733 Obstructive sleep apnea (adult) (pediatric): Secondary | ICD-10-CM | POA: Diagnosis not present

## 2020-05-14 DIAGNOSIS — G4733 Obstructive sleep apnea (adult) (pediatric): Secondary | ICD-10-CM | POA: Diagnosis not present

## 2020-06-14 ENCOUNTER — Ambulatory Visit (INDEPENDENT_AMBULATORY_CARE_PROVIDER_SITE_OTHER): Payer: Medicare HMO | Admitting: Internal Medicine

## 2020-06-14 ENCOUNTER — Encounter: Payer: Self-pay | Admitting: Internal Medicine

## 2020-06-14 ENCOUNTER — Other Ambulatory Visit: Payer: Self-pay

## 2020-06-14 VITALS — BP 126/83 | HR 68 | Ht 68.0 in | Wt 205.3 lb

## 2020-06-14 DIAGNOSIS — I10 Essential (primary) hypertension: Secondary | ICD-10-CM

## 2020-06-14 DIAGNOSIS — E8881 Metabolic syndrome: Secondary | ICD-10-CM | POA: Insufficient documentation

## 2020-06-14 DIAGNOSIS — E119 Type 2 diabetes mellitus without complications: Secondary | ICD-10-CM | POA: Diagnosis not present

## 2020-06-14 DIAGNOSIS — E7849 Other hyperlipidemia: Secondary | ICD-10-CM | POA: Insufficient documentation

## 2020-06-14 DIAGNOSIS — G4733 Obstructive sleep apnea (adult) (pediatric): Secondary | ICD-10-CM | POA: Diagnosis not present

## 2020-06-14 DIAGNOSIS — R079 Chest pain, unspecified: Secondary | ICD-10-CM | POA: Insufficient documentation

## 2020-06-14 LAB — GLUCOSE, POCT (MANUAL RESULT ENTRY): POC Glucose: 265 mg/dl — AB (ref 70–99)

## 2020-06-14 NOTE — Assessment & Plan Note (Signed)
Will do  Echo  And stress test

## 2020-06-14 NOTE — Assessment & Plan Note (Signed)
-   The patient's hyperlipidemia is stable on zocor. - The patient will continue the current treatment regimen.  - I encouraged the patient to eat more vegetables and whole wheat, and to avoid fatty foods like whole milk, hard cheese, egg yolks, margarine, baked sweets, and fried foods.  - I encouraged the patient to live an active lifestyle and complete activities for 40 minutes at least three times per week.  - I instructed the patient to go to the ER if they begin having chest pain.

## 2020-06-14 NOTE — Progress Notes (Signed)
Established Patient Office Visit  Subjective:  Patient ID: Gary Wallace, male    DOB: January 20, 1951  Age: 69 y.o. MRN: [id number]  CC:  Chief Complaint  Patient presents with  . Diabetes    Diabetes He presents for his follow-up diabetic visit. He has type 2 diabetes mellitus. His disease course has been fluctuating. Pertinent negatives for hypoglycemia include no dizziness, headaches, hunger, mood changes, nervousness/anxiousness, seizures, sleepiness, sweats or tremors. Associated symptoms include chest pain. Pertinent negatives for diabetes include no blurred vision. (Patient complaining of tight feeling in the chest after picking up heavy stuff.  Any palpitation.  No history of heart problems.) Pertinent negatives for hypoglycemia complications include no nocturnal hypoglycemia. Pertinent negatives for diabetic complications include no CVA, heart disease, impotence or peripheral neuropathy. Risk factors for coronary artery disease include dyslipidemia, diabetes mellitus, male sex, obesity and hypertension. Current diabetic treatment includes diet and oral agent (dual therapy). He is following a diabetic diet. Meal planning includes calorie counting. He rarely participates in exercise. His home blood glucose trend is fluctuating minimally. An ACE inhibitor/angiotensin II receptor blocker is being taken. He sees a podiatrist.Eye exam is current.    Gary Wallace presents for diabetes  Past Medical History:  Diagnosis Date  . Hyperlipemia   . Hypertension     Past Surgical History:  Procedure Laterality Date  . PERIPHERAL VASCULAR CATHETERIZATION Right 05/27/2015   Procedure: Upper Extremity Angiography;  Surgeon: Algernon Huxley, MD;  Location: [facility];  Service: Cardiovascular;  Laterality: Right;  . PERIPHERAL VASCULAR CATHETERIZATION Right 05/27/2015   Procedure: Upper Extremity Intervention;  Surgeon: Algernon Huxley, MD;  Location: [facility];  Service:  Cardiovascular;  Laterality: Right;    History reviewed. No pertinent family history.  Social History   Socioeconomic History  . Marital status: Married    Spouse name: Not on file  . Number of children: Not on file  . Years of education: Not on file  . Highest education level: Not on file  Occupational History  . Not on file  Tobacco Use  . Smoking status: Former Smoker    Packs/day: 1.00    Years: 30.00    Pack years: 30.00    Quit date: 05/26/1998    Years since quitting: 22.0  . Smokeless tobacco: Never Used  Substance and Sexual Activity  . Alcohol use: No  . Drug use: No  . Sexual activity: Not on file  Other Topics Concern  . Not on file  Social History Narrative  . Not on file   Social Determinants of Health   Financial Resource Strain:   . Difficulty of Paying Living Expenses: Not on file  Food Insecurity:   . Worried About Charity fundraiser in the Last Year: Not on file  . Ran Out of Food in the Last Year: Not on file  Transportation Needs:   . Lack of Transportation (Medical): Not on file  . Lack of Transportation (Non-Medical): Not on file  Physical Activity:   . Days of Exercise per Week: Not on file  . Minutes of Exercise per Session: Not on file  Stress:   . Feeling of Stress : Not on file  Social Connections:   . Frequency of Communication with Friends and Family: Not on file  . Frequency of Social Gatherings with Friends and Family: Not on file  . Attends Religious Services: Not on file  . Active Member of Clubs or Organizations: Not  on file  . Attends Archivist Meetings: Not on file  . Marital Status: Not on file  Intimate Partner Violence:   . Fear of Current or Ex-Partner: Not on file  . Emotionally Abused: Not on file  . Physically Abused: Not on file  . Sexually Abused: Not on file     Current Outpatient Medications:  .  aspirin EC 81 MG tablet, Take 81 mg by mouth daily., Disp: , Rfl:  .  cetirizine (ZYRTEC) 10 MG  tablet, Take 10 mg by mouth daily., Disp: , Rfl:  .  fluticasone (FLONASE) 50 MCG/ACT nasal spray, Place 2 sprays into both nostrils daily., Disp: , Rfl:  .  glipiZIDE (GLUCOTROL) 10 MG tablet, Take 1 tablet (10 mg total) by mouth daily before breakfast., Disp: 30 tablet, Rfl: 6 .  lisinopril (ZESTRIL) 20 MG tablet, Take 1 tablet (20 mg total) by mouth daily., Disp: 90 tablet, Rfl: 2 .  Multiple Vitamins-Minerals (MULTIVITAMIN WITH MINERALS) tablet, Take 1 tablet by mouth daily., Disp: , Rfl:  .  pantoprazole (PROTONIX) 20 MG tablet, Take 20 mg by mouth daily., Disp: , Rfl:  .  simvastatin (ZOCOR) 20 MG tablet, Take 20 mg by mouth daily., Disp: , Rfl:   Current Facility-Administered Medications:  .  betamethasone acetate-betamethasone sodium phosphate (CELESTONE) injection 3 mg, 3 mg, Intramuscular, Once, Evans, Brent M, DPM   No Known Allergies  ROS Review of Systems  Constitutional: Negative.   HENT: Negative.   Eyes: Negative.  Negative for blurred vision.  Respiratory: Negative.   Cardiovascular: Positive for chest pain.  Gastrointestinal: Negative for blood in stool.  Genitourinary: Negative.  Negative for impotence.  Neurological: Negative for dizziness, tremors, seizures and headaches.  Psychiatric/Behavioral: Negative for dysphoric mood. The patient is not nervous/anxious.       Objective:    Physical Exam Vitals reviewed.  Constitutional:      Appearance: Normal appearance.  HENT:     Mouth/Throat:     Mouth: Mucous membranes are moist.  Eyes:     Pupils: Pupils are equal, round, and reactive to light.  Neck:     Vascular: No carotid bruit.  Cardiovascular:     Rate and Rhythm: Normal rate and regular rhythm.     Pulses: Normal pulses.     Heart sounds: Normal heart sounds.  Pulmonary:     Effort: Pulmonary effort is normal.     Breath sounds: Normal breath sounds.  Abdominal:     General: Bowel sounds are normal.     Palpations: Abdomen is soft. There is no  hepatomegaly, splenomegaly or mass.     Tenderness: There is no abdominal tenderness.     Hernia: No hernia is present.  Musculoskeletal:     Cervical back: Neck supple.     Right lower leg: No edema.     Left lower leg: No edema.  Skin:    Findings: No rash.  Neurological:     Mental Status: He is alert and oriented to person, place, and time.     Motor: No weakness.  Psychiatric:        Mood and Affect: Mood normal.        Behavior: Behavior normal.     BP 126/83   Pulse 68   Ht 5\' 8"  (1.727 m)   Wt 205 lb 4.8 oz (93.1 kg)   BMI 31.22 kg/m  Wt Readings from Last 3 Encounters:  06/14/20 205 lb 4.8 oz (93.1 kg)  03/12/20  202 lb 9.6 oz (91.9 kg)  05/02/19 212 lb 1.3 oz (96.2 kg)     Health Maintenance Due  Topic Date Due  . Hepatitis C Screening  Never done  . FOOT EXAM  Never done  . OPHTHALMOLOGY EXAM  Never done  . COVID-19 Vaccine (1) Never done  . TETANUS/TDAP  Never done  . COLONOSCOPY  Never done  . PNA vac Low Risk Adult (1 of 2 - PCV13) Never done  . INFLUENZA VACCINE  05/23/2020    There are no preventive care reminders to display for this patient.  Lab Results  Component Value Date   TSH 1.49 03/12/2020   Lab Results  Component Value Date   WBC CANCELED 03/12/2020   Lab Results  Component Value Date   NA 137 03/12/2020   K 4.8 03/12/2020   CO2 20 03/12/2020   GLUCOSE 196 (H) 03/12/2020   BUN 15 03/12/2020   CREATININE 1.00 03/12/2020   BILITOT 0.8 03/12/2020   AST 46 (H) 03/12/2020   ALT 37 03/12/2020   PROT 7.3 03/12/2020   CALCIUM 9.4 03/12/2020   ANIONGAP 8 05/27/2015   No results found for: CHOL No results found for: HDL No results found for: LDLCALC No results found for: TRIG No results found for: CHOLHDL Lab Results  Component Value Date   HGBA1C 6.1 (A) 03/12/2020      Assessment & Plan:   Problem List Items Addressed This Visit      Cardiovascular and Mediastinum   Hypertension    - Today, the patient's blood  pressure is well managed on lisinopril. - The patient will continue the current treatment regimen.  - I encouraged the patient to eat a low-sodium diet to help control blood pressure. - I encouraged the patient to live an active lifestyle and complete activities that increases heart rate to 85% target heart rate at least 5 times per week for one hour.            Endocrine   Type 2 diabetes mellitus without complication, without long-term current use of insulin ([place]) - Primary    - The patient's blood sugar is not under control on metformin and glipizide. - The patient will change the current treatment regimen.  - I encouraged the patient to regularly check blood sugar.  - I encouraged the patient to monitor diet. I encouraged the patient to eat low-carb and low-sugar to help prevent blood sugar spikes.  - I encouraged the patient to continue following their prescribed treatment plan for diabetes - I informed the patient to get help if blood sugar drops below 54mg /dL, or if suddenly have trouble thinking clearly or breathing.          Relevant Orders   POCT glucose (manual entry) (Completed)     Other   Hyperlipemia, fat-induced    - The patient's hyperlipidemia is stable on zocor. - The patient will continue the current treatment regimen.  - I encouraged the patient to eat more vegetables and whole wheat, and to avoid fatty foods like whole milk, hard cheese, egg yolks, margarine, baked sweets, and fried foods.  - I encouraged the patient to live an active lifestyle and complete activities for 40 minutes at least three times per week.  - I instructed the patient to go to the ER if they begin having chest pain.       Metabolic syndrome    Lose wt      Chest pain at rest  Will do  Echo  And stress test         Will get echo  And  Stress test  Follow-up: No follow-ups on file.    Cletis Athens, MD

## 2020-06-14 NOTE — Assessment & Plan Note (Signed)
Lose wt 

## 2020-06-14 NOTE — Assessment & Plan Note (Signed)
-   The patient's blood sugar is not under control on metformin and glipizide. - The patient will change the current treatment regimen.  - I encouraged the patient to regularly check blood sugar.  - I encouraged the patient to monitor diet. I encouraged the patient to eat low-carb and low-sugar to help prevent blood sugar spikes.  - I encouraged the patient to continue following their prescribed treatment plan for diabetes - I informed the patient to get help if blood sugar drops below 54mg /dL, or if suddenly have trouble thinking clearly or breathing.

## 2020-06-14 NOTE — Assessment & Plan Note (Signed)
-   Today, the patient's blood pressure is well managed on lisinopril. - The patient will continue the current treatment regimen.  - I encouraged the patient to eat a low-sodium diet to help control blood pressure. - I encouraged the patient to live an active lifestyle and complete activities that increases heart rate to 85% target heart rate at least 5 times per week for one hour.     

## 2020-07-19 ENCOUNTER — Other Ambulatory Visit: Payer: Medicare HMO

## 2020-07-20 ENCOUNTER — Other Ambulatory Visit: Payer: Medicare HMO

## 2020-07-20 ENCOUNTER — Other Ambulatory Visit: Payer: Self-pay

## 2020-07-20 DIAGNOSIS — G4733 Obstructive sleep apnea (adult) (pediatric): Secondary | ICD-10-CM | POA: Diagnosis not present

## 2020-07-20 NOTE — Progress Notes (Signed)
    PATIENT ID: Gary Wallace, male     DOB: 10-12-1951, 69 y.o.     MRN: [id number]   Procedure Note Stress Test  Reason for Study: Patient notes a recent history of chest pain at rest.    Study:  Treadmill Stress Test                     Impression : Patient was exercised according to [facility] protocol .  Stress test was stopped at the end of 6 minutes minutes due to shortness of breath patient did not have any chest pain.  No ST changes of myocardial ischemia were noted.  Maximum blood pressure achieved was 150/80 patient did not have any arrhythmia during or after the stress test.  Exercise test showed good function capacity.    .    Interpreted by: Cletis Athens, MD 07/20/20 6:35 PM    By signing my name below, I, Jacqualyn Posey, attest that this documentation has been prepared under the direction and in the presence of Cletis Athens, MD. Electronically Signed: Cletis Athens, MD 07/20/20, 6:35 PM

## 2020-07-28 ENCOUNTER — Other Ambulatory Visit: Payer: Medicare HMO

## 2020-07-29 ENCOUNTER — Other Ambulatory Visit: Payer: Self-pay | Admitting: Internal Medicine

## 2020-08-19 DIAGNOSIS — G4733 Obstructive sleep apnea (adult) (pediatric): Secondary | ICD-10-CM | POA: Diagnosis not present

## 2020-08-31 DIAGNOSIS — M79662 Pain in left lower leg: Secondary | ICD-10-CM | POA: Diagnosis not present

## 2020-08-31 DIAGNOSIS — M4607 Spinal enthesopathy, lumbosacral region: Secondary | ICD-10-CM | POA: Diagnosis not present

## 2020-08-31 DIAGNOSIS — M9903 Segmental and somatic dysfunction of lumbar region: Secondary | ICD-10-CM | POA: Diagnosis not present

## 2020-08-31 DIAGNOSIS — M9906 Segmental and somatic dysfunction of lower extremity: Secondary | ICD-10-CM | POA: Diagnosis not present

## 2020-08-31 DIAGNOSIS — M9904 Segmental and somatic dysfunction of sacral region: Secondary | ICD-10-CM | POA: Diagnosis not present

## 2020-09-03 DIAGNOSIS — G8929 Other chronic pain: Secondary | ICD-10-CM | POA: Diagnosis not present

## 2020-09-03 DIAGNOSIS — M1712 Unilateral primary osteoarthritis, left knee: Secondary | ICD-10-CM | POA: Diagnosis not present

## 2020-09-03 DIAGNOSIS — M25562 Pain in left knee: Secondary | ICD-10-CM | POA: Diagnosis not present

## 2020-09-03 DIAGNOSIS — M25462 Effusion, left knee: Secondary | ICD-10-CM | POA: Diagnosis not present

## 2020-09-19 ENCOUNTER — Encounter: Payer: Self-pay | Admitting: Physician Assistant

## 2020-09-19 ENCOUNTER — Telehealth: Payer: Medicare HMO | Admitting: Physician Assistant

## 2020-09-19 DIAGNOSIS — J029 Acute pharyngitis, unspecified: Secondary | ICD-10-CM | POA: Diagnosis not present

## 2020-09-19 DIAGNOSIS — R059 Cough, unspecified: Secondary | ICD-10-CM | POA: Diagnosis not present

## 2020-09-19 DIAGNOSIS — G4733 Obstructive sleep apnea (adult) (pediatric): Secondary | ICD-10-CM | POA: Diagnosis not present

## 2020-09-19 DIAGNOSIS — Z20822 Contact with and (suspected) exposure to covid-19: Secondary | ICD-10-CM

## 2020-09-19 MED ORDER — BENZONATATE 100 MG PO CAPS: 100.0000 mg | ORAL_CAPSULE | Freq: Three times a day (TID) | ORAL | 0 refills | Status: DC | PRN

## 2020-09-19 NOTE — Progress Notes (Signed)
E-Visit for Corona Virus Screening  Your current symptoms could be consistent with the coronavirus.  Many health care providers can now test patients at their office but not all are.  [facility] has multiple testing sites. For information on our COVID testing locations and hours go to [email or web address]  We are enrolling you in our MyChart Home Monitoring for COVID19 . Daily you will receive a questionnaire within the MyChart website. Our COVID 19 response team will be monitoring your responses daily.  Testing Information: The COVID-19 Community Testing sites are testing BY APPOINTMENT ONLY.  You can schedule online at [email or web address]  If you do not have access to a smart phone or computer you may call [phone] for an appointment.   Additional testing sites in the Community:  . For [facility] Testing sites in [place]  https://www.[facility]vs.com/minuteclinic/covid-19-testing  . For Pop-up testing sites in [place]  https://covid19.ncdhhs.gov/about-covid-19/testing/find-my-testing-place/pop-testing-sites  . For [facility] https://[place].gov[place]/human-services/health-department/coronavirus-covid-19-info/covid-19-testing  . For [place] testing in [place] and [place] https://www.[place].gov/[place]/human-services/health-department/coronavirus-covid-19-info/covid-19-testing  . For [facility] testing in [place]   https://[facility].care/covidtesting  For  more information about community testing call [phone]   Please quarantine yourself while awaiting your test results. Please stay home for a minimum of 10 days from the first day of illness with improving symptoms and you have had 24 hours of no fever (without the use of Tylenol (Acetaminophen) Motrin (Ibuprofen) or any fever reducing medication).  Also - Do not get tested prior to returning to work because once you have had a positive test the test can stay  positive for more than a month in some cases.   You should wear a mask or cloth face covering over your nose and mouth if you must be around other people or animals, including pets (even at home). Try to stay at least 6 feet away from other people. This will protect the people around you.  Please continue good preventive care measures, including:  frequent hand-washing, avoid touching your face, cover coughs/sneezes, stay out of crowds and keep a 6 foot distance from others.  COVID-19 is a respiratory illness with symptoms that are similar to the flu. Symptoms are typically mild to moderate, but there have been cases of severe illness and death due to the virus.   The following symptoms may appear 2-14 days after exposure: . Fever . Cough . Shortness of breath or difficulty breathing . Chills . Repeated shaking with chills . Muscle pain . Headache . Sore throat . New loss of taste or smell . Fatigue . Congestion or runny nose . Nausea or vomiting . Diarrhea  Go to the nearest hospital ED for assessment if fever/cough/breathlessness are severe or illness seems like a threat to life.  It is vitally important that if you feel that you have an infection such as this virus or any other virus that you stay home and away from places where you may spread it to others.  You should avoid contact with people age 65 and older.   You can use medication such as A prescription cough medication called Tessalon Perles 100 mg. You may take 1-2 capsules every 8 hours as needed for cough  You may also take acetaminophen (Tylenol) as needed for fever.  I have also provided a work note.  Reduce your risk of any infection by using the same precautions used for avoiding the common cold or flu:  . Wash your hands often with soap and warm water for at least 20   seconds.  If soap and water are not readily available, use an alcohol-based hand sanitizer with at least 60% alcohol.  . If coughing or sneezing, cover  your mouth and nose by coughing or sneezing into the elbow areas of your shirt or coat, into a tissue or into your sleeve (not your hands). . Avoid shaking hands with others and consider head nods or verbal greetings only. . Avoid touching your eyes, nose, or mouth with unwashed hands.  . Avoid close contact with people who are sick. . Avoid places or events with large numbers of people in one location, like concerts or sporting events. . Carefully consider travel plans you have or are making. . If you are planning any travel outside or inside the [place], visit the CDC's Travelers' Health webpage for the latest health notices. . If you have some symptoms but not all symptoms, continue to monitor at home and seek medical attention if your symptoms worsen. . If you are having a medical emergency, call 911.  HOME CARE . Only take medications as instructed by your medical team. . Drink plenty of fluids and get plenty of rest. . A steam or ultrasonic humidifier can help if you have congestion.   GET HELP RIGHT AWAY IF YOU HAVE EMERGENCY WARNING SIGNS** FOR COVID-19. If you or someone is showing any of these signs seek emergency medical care immediately. Call 911 or proceed to your closest emergency facility if: . You develop worsening high fever. . Trouble breathing . Bluish lips or face . Persistent pain or pressure in the chest . New confusion . Inability to wake or stay awake . You cough up blood. . Your symptoms become more severe  **This list is not all possible symptoms. Contact your medical provider for any symptoms that are sever or concerning to you.  MAKE SURE YOU   Understand these instructions.  Will watch your condition.  Will get help right away if you are not doing well or get worse.  Your e-visit answers were reviewed by a board certified advanced clinical practitioner to complete your personal care plan.  Depending on the condition, your plan could have included both over  the counter or prescription medications.  If there is a problem please reply once you have received a response from your provider.  Your safety is important to us.  If you have drug allergies check your prescription carefully.    You can use MyChart to ask questions about today's visit, request a non-urgent call back, or ask for a work or school excuse for 24 hours related to this e-Visit. If it has been greater than 24 hours you will need to follow up with your provider, or enter a new e-Visit to address those concerns. You will get an e-mail in the next two days asking about your experience.  I hope that your e-visit has been valuable and will speed your recovery. Thank you for using e-visits. I spent 5-10 minutes on review and completion of this note- Ivar Domangue PAC  

## 2020-09-21 ENCOUNTER — Telehealth: Payer: Medicare HMO | Admitting: Physician Assistant

## 2020-09-21 DIAGNOSIS — B9789 Other viral agents as the cause of diseases classified elsewhere: Secondary | ICD-10-CM

## 2020-09-21 DIAGNOSIS — R059 Cough, unspecified: Secondary | ICD-10-CM

## 2020-09-21 MED ORDER — AZELASTINE HCL 0.1 % NA SOLN: 2.0000 | Freq: Two times a day (BID) | NASAL | 0 refills | Status: DC

## 2020-09-21 NOTE — Progress Notes (Signed)
I am glad to hear your COVID-19 test was negative.   Based on what you have shared with me it looks like you have sinusitis.  Sinusitis is inflammation and infection in the sinus cavities of the head.  Based on your presentation I believe you most likely have Acute Viral Sinusitis.This is an infection most likely caused by a virus. There is not specific treatment for viral sinusitis other than to help you with the symptoms until the infection runs its course.    You may use an oral decongestant such as Mucinex D or if you have glaucoma or high blood pressure use plain Mucinex. Saline nasal spray help and can safely be used as often as needed for congestion, I have prescribed: Azelastine nasal spray 2 sprays in each nostril twice a day  Some authorities believe that zinc sprays or the use of Echinacea may shorten the course of your symptoms.  Sinus infections are not as easily transmitted as other respiratory infection, however we still recommend that you avoid close contact with loved ones, especially the very young and elderly.  Remember to wash your hands thoroughly throughout the day as this is the number one way to prevent the spread of infection!  Home Care:  Only take medications as instructed by your medical team.  Do not take these medications with alcohol.  A steam or ultrasonic humidifier can help congestion.  You can place a towel over your head and breathe in the steam from hot water coming from a faucet.  Avoid close contacts especially the very young and the elderly.  Cover your mouth when you cough or sneeze.  Always remember to wash your hands.  Get Help Right Away If:  You develop worsening fever or sinus pain.  You develop a severe head ache or visual changes.  Your symptoms persist after you have completed your treatment plan.  Make sure you  Understand these instructions.  Will watch your condition.  Will get help right away if you are not doing well or get  worse.  Your e-visit answers were reviewed by a board certified advanced clinical practitioner to complete your personal care plan.  Depending on the condition, your plan could have included both over the counter or prescription medications.  If there is a problem please reply  once you have received a response from your provider.  Your safety is important to [place].  If you have drug allergies check your prescription carefully.    You can use MyChart to ask questions about todays visit, request a non-urgent call back, or ask for a work or school excuse for 24 hours related to this e-Visit. If it has been greater than 24 hours you will need to follow up with your provider, or enter a new e-Visit to address those concerns.  You will get an e-mail in the next two days asking about your experience.  I hope that your e-visit has been valuable and will speed your recovery. Thank you for using e-visits.   Greater than 5 minutes, yet less than 10 minutes of time have been spent researching, coordinating and implementing care for this patient today.

## 2020-09-25 ENCOUNTER — Encounter (INDEPENDENT_AMBULATORY_CARE_PROVIDER_SITE_OTHER): Payer: Self-pay

## 2020-09-26 ENCOUNTER — Telehealth: Payer: Self-pay

## 2020-09-26 NOTE — Telephone Encounter (Signed)
Pt indicated on Covid sx questionnaire that cough is worse.  Attempted to call pt but did not get an answer. Will send home care advice for cough. . If cough remains the same or better: continue to treat with over the counter medications.  Hard candy or cough drops and drinking warm fluids. Adults can also use honey 2 tsp (10 ML) at bedtime.  . If cough is becoming worse even with the use of over the counter medications and patient is not able to sleep at night, cough becomes productive with sputum that maybe yellow or green in color, contact PCP.

## 2020-10-19 DIAGNOSIS — X32XXXA Exposure to sunlight, initial encounter: Secondary | ICD-10-CM | POA: Diagnosis not present

## 2020-10-19 DIAGNOSIS — D2261 Melanocytic nevi of right upper limb, including shoulder: Secondary | ICD-10-CM | POA: Diagnosis not present

## 2020-10-19 DIAGNOSIS — L570 Actinic keratosis: Secondary | ICD-10-CM | POA: Diagnosis not present

## 2020-10-19 DIAGNOSIS — D225 Melanocytic nevi of trunk: Secondary | ICD-10-CM | POA: Diagnosis not present

## 2020-10-19 DIAGNOSIS — Z85828 Personal history of other malignant neoplasm of skin: Secondary | ICD-10-CM | POA: Diagnosis not present

## 2020-10-19 DIAGNOSIS — D2262 Melanocytic nevi of left upper limb, including shoulder: Secondary | ICD-10-CM | POA: Diagnosis not present

## 2020-10-19 DIAGNOSIS — D2271 Melanocytic nevi of right lower limb, including hip: Secondary | ICD-10-CM | POA: Diagnosis not present

## 2020-11-04 DIAGNOSIS — H524 Presbyopia: Secondary | ICD-10-CM | POA: Diagnosis not present

## 2020-11-04 DIAGNOSIS — E113393 Type 2 diabetes mellitus with moderate nonproliferative diabetic retinopathy without macular edema, bilateral: Secondary | ICD-10-CM | POA: Diagnosis not present

## 2020-11-24 DIAGNOSIS — I10 Essential (primary) hypertension: Secondary | ICD-10-CM | POA: Diagnosis not present

## 2020-11-24 DIAGNOSIS — S299XXA Unspecified injury of thorax, initial encounter: Secondary | ICD-10-CM | POA: Diagnosis not present

## 2020-11-24 DIAGNOSIS — J9811 Atelectasis: Secondary | ICD-10-CM | POA: Diagnosis not present

## 2020-11-24 DIAGNOSIS — Z1159 Encounter for screening for other viral diseases: Secondary | ICD-10-CM | POA: Diagnosis not present

## 2020-11-24 DIAGNOSIS — Z0000 Encounter for general adult medical examination without abnormal findings: Secondary | ICD-10-CM | POA: Diagnosis not present

## 2020-11-24 DIAGNOSIS — K219 Gastro-esophageal reflux disease without esophagitis: Secondary | ICD-10-CM | POA: Diagnosis not present

## 2020-11-24 DIAGNOSIS — K760 Fatty (change of) liver, not elsewhere classified: Secondary | ICD-10-CM | POA: Diagnosis not present

## 2020-11-24 DIAGNOSIS — E785 Hyperlipidemia, unspecified: Secondary | ICD-10-CM | POA: Diagnosis not present

## 2020-11-24 DIAGNOSIS — R079 Chest pain, unspecified: Secondary | ICD-10-CM | POA: Diagnosis not present

## 2020-11-24 DIAGNOSIS — E119 Type 2 diabetes mellitus without complications: Secondary | ICD-10-CM | POA: Diagnosis not present

## 2020-11-26 DIAGNOSIS — R079 Chest pain, unspecified: Secondary | ICD-10-CM | POA: Diagnosis not present

## 2020-11-26 DIAGNOSIS — E8881 Metabolic syndrome: Secondary | ICD-10-CM | POA: Diagnosis not present

## 2020-11-26 DIAGNOSIS — E7800 Pure hypercholesterolemia, unspecified: Secondary | ICD-10-CM | POA: Diagnosis not present

## 2020-11-26 DIAGNOSIS — E119 Type 2 diabetes mellitus without complications: Secondary | ICD-10-CM | POA: Diagnosis not present

## 2020-11-26 DIAGNOSIS — I10 Essential (primary) hypertension: Secondary | ICD-10-CM | POA: Diagnosis not present

## 2020-12-21 DIAGNOSIS — R079 Chest pain, unspecified: Secondary | ICD-10-CM | POA: Diagnosis not present

## 2020-12-30 DIAGNOSIS — I10 Essential (primary) hypertension: Secondary | ICD-10-CM | POA: Diagnosis not present

## 2020-12-30 DIAGNOSIS — Z23 Encounter for immunization: Secondary | ICD-10-CM | POA: Diagnosis not present

## 2020-12-30 DIAGNOSIS — E7800 Pure hypercholesterolemia, unspecified: Secondary | ICD-10-CM | POA: Diagnosis not present

## 2020-12-30 DIAGNOSIS — E119 Type 2 diabetes mellitus without complications: Secondary | ICD-10-CM | POA: Diagnosis not present

## 2020-12-30 DIAGNOSIS — R079 Chest pain, unspecified: Secondary | ICD-10-CM | POA: Diagnosis not present

## 2021-02-21 DIAGNOSIS — K219 Gastro-esophageal reflux disease without esophagitis: Secondary | ICD-10-CM | POA: Diagnosis not present

## 2021-02-21 DIAGNOSIS — E785 Hyperlipidemia, unspecified: Secondary | ICD-10-CM | POA: Diagnosis not present

## 2021-02-21 DIAGNOSIS — E119 Type 2 diabetes mellitus without complications: Secondary | ICD-10-CM | POA: Diagnosis not present

## 2021-02-21 DIAGNOSIS — I10 Essential (primary) hypertension: Secondary | ICD-10-CM | POA: Diagnosis not present

## 2021-02-21 DIAGNOSIS — Z0000 Encounter for general adult medical examination without abnormal findings: Secondary | ICD-10-CM | POA: Diagnosis not present

## 2021-02-21 DIAGNOSIS — Z125 Encounter for screening for malignant neoplasm of prostate: Secondary | ICD-10-CM | POA: Diagnosis not present

## 2021-02-21 DIAGNOSIS — R202 Paresthesia of skin: Secondary | ICD-10-CM | POA: Diagnosis not present

## 2021-02-25 DIAGNOSIS — E538 Deficiency of other specified B group vitamins: Secondary | ICD-10-CM | POA: Diagnosis not present

## 2021-02-28 DIAGNOSIS — G4733 Obstructive sleep apnea (adult) (pediatric): Secondary | ICD-10-CM | POA: Diagnosis not present

## 2021-03-04 DIAGNOSIS — E538 Deficiency of other specified B group vitamins: Secondary | ICD-10-CM | POA: Diagnosis not present

## 2021-03-11 DIAGNOSIS — E538 Deficiency of other specified B group vitamins: Secondary | ICD-10-CM | POA: Diagnosis not present

## 2021-03-18 DIAGNOSIS — E538 Deficiency of other specified B group vitamins: Secondary | ICD-10-CM | POA: Diagnosis not present

## 2021-03-31 DIAGNOSIS — G4733 Obstructive sleep apnea (adult) (pediatric): Secondary | ICD-10-CM | POA: Diagnosis not present

## 2021-04-19 DIAGNOSIS — D2272 Melanocytic nevi of left lower limb, including hip: Secondary | ICD-10-CM | POA: Diagnosis not present

## 2021-04-19 DIAGNOSIS — D485 Neoplasm of uncertain behavior of skin: Secondary | ICD-10-CM | POA: Diagnosis not present

## 2021-04-19 DIAGNOSIS — D044 Carcinoma in situ of skin of scalp and neck: Secondary | ICD-10-CM | POA: Diagnosis not present

## 2021-04-19 DIAGNOSIS — X32XXXA Exposure to sunlight, initial encounter: Secondary | ICD-10-CM | POA: Diagnosis not present

## 2021-04-19 DIAGNOSIS — D2261 Melanocytic nevi of right upper limb, including shoulder: Secondary | ICD-10-CM | POA: Diagnosis not present

## 2021-04-19 DIAGNOSIS — D2262 Melanocytic nevi of left upper limb, including shoulder: Secondary | ICD-10-CM | POA: Diagnosis not present

## 2021-04-19 DIAGNOSIS — L570 Actinic keratosis: Secondary | ICD-10-CM | POA: Diagnosis not present

## 2021-04-19 DIAGNOSIS — Z85828 Personal history of other malignant neoplasm of skin: Secondary | ICD-10-CM | POA: Diagnosis not present

## 2021-04-30 DIAGNOSIS — G4733 Obstructive sleep apnea (adult) (pediatric): Secondary | ICD-10-CM | POA: Diagnosis not present

## 2021-05-06 DIAGNOSIS — L570 Actinic keratosis: Secondary | ICD-10-CM | POA: Diagnosis not present

## 2021-05-06 DIAGNOSIS — D044 Carcinoma in situ of skin of scalp and neck: Secondary | ICD-10-CM | POA: Diagnosis not present

## 2021-05-09 DIAGNOSIS — E113393 Type 2 diabetes mellitus with moderate nonproliferative diabetic retinopathy without macular edema, bilateral: Secondary | ICD-10-CM | POA: Diagnosis not present

## 2021-05-11 DIAGNOSIS — E119 Type 2 diabetes mellitus without complications: Secondary | ICD-10-CM | POA: Diagnosis not present

## 2021-05-11 DIAGNOSIS — E8881 Metabolic syndrome: Secondary | ICD-10-CM | POA: Diagnosis not present

## 2021-05-11 DIAGNOSIS — E7800 Pure hypercholesterolemia, unspecified: Secondary | ICD-10-CM | POA: Diagnosis not present

## 2021-05-11 DIAGNOSIS — I10 Essential (primary) hypertension: Secondary | ICD-10-CM | POA: Diagnosis not present

## 2021-05-11 DIAGNOSIS — R079 Chest pain, unspecified: Secondary | ICD-10-CM | POA: Diagnosis not present

## 2021-06-17 DIAGNOSIS — E119 Type 2 diabetes mellitus without complications: Secondary | ICD-10-CM | POA: Diagnosis not present

## 2021-06-17 DIAGNOSIS — Z0000 Encounter for general adult medical examination without abnormal findings: Secondary | ICD-10-CM | POA: Diagnosis not present

## 2021-06-17 DIAGNOSIS — I10 Essential (primary) hypertension: Secondary | ICD-10-CM | POA: Diagnosis not present

## 2021-06-17 DIAGNOSIS — K219 Gastro-esophageal reflux disease without esophagitis: Secondary | ICD-10-CM | POA: Diagnosis not present

## 2021-06-17 DIAGNOSIS — E785 Hyperlipidemia, unspecified: Secondary | ICD-10-CM | POA: Diagnosis not present

## 2021-06-17 DIAGNOSIS — R202 Paresthesia of skin: Secondary | ICD-10-CM | POA: Diagnosis not present

## 2021-06-24 DIAGNOSIS — E785 Hyperlipidemia, unspecified: Secondary | ICD-10-CM | POA: Diagnosis not present

## 2021-06-24 DIAGNOSIS — E119 Type 2 diabetes mellitus without complications: Secondary | ICD-10-CM | POA: Diagnosis not present

## 2021-06-24 DIAGNOSIS — K760 Fatty (change of) liver, not elsewhere classified: Secondary | ICD-10-CM | POA: Diagnosis not present

## 2021-06-24 DIAGNOSIS — E538 Deficiency of other specified B group vitamins: Secondary | ICD-10-CM | POA: Diagnosis not present

## 2021-06-24 DIAGNOSIS — I10 Essential (primary) hypertension: Secondary | ICD-10-CM | POA: Diagnosis not present

## 2021-08-12 DIAGNOSIS — J069 Acute upper respiratory infection, unspecified: Secondary | ICD-10-CM | POA: Diagnosis not present

## 2021-08-17 DIAGNOSIS — M25561 Pain in right knee: Secondary | ICD-10-CM | POA: Diagnosis not present

## 2021-08-17 DIAGNOSIS — M1711 Unilateral primary osteoarthritis, right knee: Secondary | ICD-10-CM | POA: Diagnosis not present

## 2021-08-17 DIAGNOSIS — M11261 Other chondrocalcinosis, right knee: Secondary | ICD-10-CM | POA: Diagnosis not present

## 2021-08-17 DIAGNOSIS — M25461 Effusion, right knee: Secondary | ICD-10-CM | POA: Diagnosis not present

## 2021-09-26 DIAGNOSIS — L821 Other seborrheic keratosis: Secondary | ICD-10-CM | POA: Diagnosis not present

## 2021-09-26 DIAGNOSIS — D225 Melanocytic nevi of trunk: Secondary | ICD-10-CM | POA: Diagnosis not present

## 2021-09-26 DIAGNOSIS — X32XXXA Exposure to sunlight, initial encounter: Secondary | ICD-10-CM | POA: Diagnosis not present

## 2021-09-26 DIAGNOSIS — D2272 Melanocytic nevi of left lower limb, including hip: Secondary | ICD-10-CM | POA: Diagnosis not present

## 2021-09-26 DIAGNOSIS — Z85828 Personal history of other malignant neoplasm of skin: Secondary | ICD-10-CM | POA: Diagnosis not present

## 2021-09-26 DIAGNOSIS — L570 Actinic keratosis: Secondary | ICD-10-CM | POA: Diagnosis not present

## 2021-09-26 DIAGNOSIS — D2262 Melanocytic nevi of left upper limb, including shoulder: Secondary | ICD-10-CM | POA: Diagnosis not present

## 2021-10-10 DIAGNOSIS — R0789 Other chest pain: Secondary | ICD-10-CM | POA: Diagnosis not present

## 2021-10-10 DIAGNOSIS — I10 Essential (primary) hypertension: Secondary | ICD-10-CM | POA: Diagnosis not present

## 2021-10-10 DIAGNOSIS — Z23 Encounter for immunization: Secondary | ICD-10-CM | POA: Diagnosis not present

## 2021-10-10 DIAGNOSIS — E7800 Pure hypercholesterolemia, unspecified: Secondary | ICD-10-CM | POA: Diagnosis not present

## 2021-10-19 DIAGNOSIS — E785 Hyperlipidemia, unspecified: Secondary | ICD-10-CM | POA: Diagnosis not present

## 2021-10-19 DIAGNOSIS — I10 Essential (primary) hypertension: Secondary | ICD-10-CM | POA: Diagnosis not present

## 2021-10-19 DIAGNOSIS — E119 Type 2 diabetes mellitus without complications: Secondary | ICD-10-CM | POA: Diagnosis not present

## 2021-10-19 DIAGNOSIS — K760 Fatty (change of) liver, not elsewhere classified: Secondary | ICD-10-CM | POA: Diagnosis not present

## 2021-10-19 DIAGNOSIS — E538 Deficiency of other specified B group vitamins: Secondary | ICD-10-CM | POA: Diagnosis not present

## 2021-10-26 NOTE — Progress Notes (Signed)
 Gary Wallace is a 71 y.o. male here for follow-up evaluation.  Chief Complaint Chief Complaint  Patient presents with  . Follow-up    4 month recheck   Complains of R side intermittent groin pain    HPI He has a history of type 2 diabetes, chronic kidney disease, fatty liver, GERD, hyperlipidemia, hypertension, sleep apnea, osteoarthritis. He  continues to follow with cardiology for his recurrent chest pain.  He has had a negative stress test and echo.  Plan is to continue current management. Diabetes - He is on glipizide  10 mg BID  as well as Metformin 1000 mg in AM and 500 mg in pm.  His sugars have been elevated.  No lows. Mild GI side effects with metformin. For his hyperlipidemia he takes simvastatin with no myalgias. Hypertension we changed him from lisinopril  20 mg once a day to losartan due to chronic cough For GERD he takes pantoprazole 40 mg once a day.   Background- married, has 2 kids, 6 grandchildren. 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  Nonsmoker - quit 1980.  No etoh but in past did drink heavy about 20 years ago.  Problem List Patient Active Problem List  Diagnosis  . Esophageal dysphagia  . GERD (gastroesophageal reflux disease)  . Screening for colon cancer  . Fatty liver disease, nonalcoholic  . Diabetes mellitus type 2, noninsulin dependent (CMS-HCC)  . Metabolic syndrome  . Essential hypertension  . Pure hypercholesterolemia  . Hyperlipemia, fat-induced  . Ulnar neuropathy at elbow  . Atypical chest pain    Past Medical History:  Diagnosis Date  . Asthma without status asthmaticus   . Chronic kidney disease    Kidney Stones  . Diabetes mellitus type 2, noninsulin dependent (CMS-HCC) 06/23/2019  . Esophageal dysphagia 07/18/2016  . Fatty liver disease, nonalcoholic 06/23/2019  . GERD (gastroesophageal reflux disease)   . Hyperlipidemia   . Hypertension   . Seasonal allergies    . Sleep apnea     Past Surgical History:  Procedure Laterality Date  . EGD  07/09/2009   06/25/2003  . COLONOSCOPY  07/09/2009   Hyperplastic Polyp: CBF 06/2019 Recall ltr mailed   . EGD  07/25/2016   Gastritis, Esophagitis: No repeat per RTE  . COLONOSCOPY  08/19/2019   Tubular adenoma of the colon/Repeat 11yrs/TKT  . EGD  08/19/2019   Gastritis/No Repeat/[facility]  . EXCISION UVULA      Social History Social History   Socioeconomic History  . Marital status: Married  Tobacco Use  . Smoking status: Former    Packs/day: 1.00    Years: 20.00    Pack years: 20.00    Types: Cigarettes    Quit date: 10/23/1988    Years since quitting: 33.0  . Smokeless tobacco: Never  Vaping Use  . Vaping Use: Never used  Substance and Sexual Activity  . Alcohol use: Not Currently    Alcohol/week: 0.0 standard drinks    Comment: Rare, Hx of heavy daily alcohol use up to 15 years ago  . Drug use: Never  . Sexual activity: Defer    Family History Family History  Problem Relation Age of Onset  . Asthma Mother   . Myocardial Infarction (Heart attack) Father     Medication   Medication List       Accurate as of October 26, 2021  8:59 AM. If you have any questions, ask your  nurse or doctor.        CONTINUE taking these medications   aspirin 81 MG EC tablet   glipiZIDE  10 MG tablet Commonly known as: GLUCOTROL  Take 1 tablet (10 mg total) by mouth 2 (two) times daily before meals for 90 days   losartan 50 MG tablet Commonly known as: COZAAR Take 1 tablet (50 mg total) by mouth once daily   metFORMIN 500 MG tablet Commonly known as: GLUCOPHAGE Take 1 tablet (500 mg total) by mouth 3 (three) times a day Take 2 in am and 1 in pm   multivitamin tablet   pantoprazole 40 MG DR tablet Commonly known as: PROTONIX Take 1 tablet (40 mg total) by mouth 2 (two) times daily before meals   rosuvastatin 5 MG tablet Commonly known as: CRESTOR Take 1 tablet (5 mg total) by mouth once  daily      Allergy No Known Allergies  Review of System A comprehensive 11 system ROS was negative except as per HPI  Physical exam BP 122/74   Pulse 77   Ht 172.7 cm (5' 8)   Wt 90.7 kg (200 lb)   SpO2 98%   BMI 30.41 kg/m   GENERAL:  The patient is alert, oriented and in no acute distress.  HEENT:  PERRLA.  Oropharynx moist without lesions.  Head is normocephalic/atraumatic.  Pupils equal, round and reactive to light and accommodation.  Extraocular movements are intact. Nasal mucosa is normal.  External ear canals normal.  Tympanic membranes are clear.   NECK:  Supple without thyromegaly or lymphadenopathy.   LUNGS:  Clear to auscultation and percussion.   CARDIAC:  Regular rate and rhythm, normal S1 and S2 without murmurs, rubs or gallops.   VASCULAR: Carotid and radial pulses 2+.  Distal pulses are 2+.   ABDOMEN:  Soft, with normal bowel sounds.  No organomegaly or tenderness was found.   EXTREMITIES:  Full range of motion with no erythema, heat or effusions.  No cyanosis, clubbing or edema noted.   GENITAL/RECTAL deferred as no complaints NEUROLOGIC:  The patient is alert and oriented.  Cranial nerves II-XII intact.  Motor and sensory examination within normal limits.   Gait normal.  Reflexes symmetric and brisk.   DERMATOLOGIC:  No skin lesions, scaling or bruising noted.    LYMPH:  No cervical or supraclavicular nodes.     Labs Ancillary Orders on 10/19/2021  Component Date Value Ref Range Status  . Hemoglobin A1C 10/19/2021 8.6 (H)  4.2 - 5.6 % Final  . Average Blood Glucose (Calc) 10/19/2021 200  mg/dL Final  . Glucose 87/71/7977 152 (H)  70 - 110 mg/dL Final  . Sodium 87/71/7977 139  136 - 145 mmol/L Final  . Potassium 10/19/2021 4.6  3.6 - 5.1 mmol/L Final  . Chloride 10/19/2021 102  97 - 109 mmol/L Final  . Carbon Dioxide (CO2) 10/19/2021 29.8  22.0 - 32.0 mmol/L Final  . Urea Nitrogen (BUN) 10/19/2021 17  7 - 25 mg/dL Final  . Creatinine 87/71/7977 1.0  0.7 -  1.3 mg/dL Final  . Glomerular Filtration Rate (eGFR),* 10/19/2021 74  >60 mL/min/1.73sq m Final  . Calcium 10/19/2021 9.6  8.7 - 10.3 mg/dL Final  . AST  87/71/7977 40 (H)  8 - 39 U/L Final  . ALT  10/19/2021 36  6 - 57 U/L Final  . Alk Phos (alkaline Phosphatase) 10/19/2021 55  34 - 104 U/L Final  . Albumin 10/19/2021 4.4  3.5 - 4.8 g/dL  Final  . Bilirubin, Total 10/19/2021 0.8  0.3 - 1.2 mg/dL Final  . Protein, Total 10/19/2021 7.0  6.1 - 7.9 g/dL Final  . A/G Ratio 87/71/7977 1.7  1.0 - 5.0 gm/dL Final  Office Visit on 08/12/2021  Component Date Value Ref Range Status  . Influenza A PCR 08/12/2021 Negative  Negative Final  . Influenza B PCR 08/12/2021 Negative  Negative Final  . RSV PCR 08/12/2021 Negative  Negative Final  . SARS-CoV2 PCR 08/12/2021 Negative  Negative Final  Ancillary Orders on 06/17/2021  Component Date Value Ref Range Status  . Hemoglobin A1C 06/17/2021 8.0 (H)  4.2 - 5.6 % Final  . Average Blood Glucose (Calc) 06/17/2021 183  mg/dL Final  . Cholesterol, Total 06/17/2021 129  100 - 200 mg/dL Final  . Triglyceride 91/73/7977 116  35 - 199 mg/dL Final  . HDL (High Density Lipoprotein) Cho* 06/17/2021 43.8  29.0 - 71.0 mg/dL Final  . LDL Calculated 06/17/2021 62  0 - 130 mg/dL Final  . VLDL Cholesterol 06/17/2021 23  mg/dL Final  . Cholesterol/HDL Ratio 06/17/2021 2.9   Final  . Glucose 06/17/2021 173 (H)  70 - 110 mg/dL Final  . Sodium 91/73/7977 137  136 - 145 mmol/L Final  . Potassium 06/17/2021 4.4  3.6 - 5.1 mmol/L Final  . Chloride 06/17/2021 102  97 - 109 mmol/L Final  . Carbon Dioxide (CO2) 06/17/2021 25.9  22.0 - 32.0 mmol/L Final  . Urea Nitrogen (BUN) 06/17/2021 17  7 - 25 mg/dL Final  . Creatinine 91/73/7977 1.1  0.7 - 1.3 mg/dL Final  . Glomerular Filtration Rate (eGFR),* 06/17/2021 66  >60 mL/min/1.73sq m Final  . Calcium 06/17/2021 9.4  8.7 - 10.3 mg/dL Final  . AST  91/73/7977 36  8 - 39 U/L Final  . ALT  06/17/2021 31  6 - 57 U/L Final  . Alk  Phos (alkaline Phosphatase) 06/17/2021 49  34 - 104 U/L Final  . Albumin 06/17/2021 4.2  3.5 - 4.8 g/dL Final  . Bilirubin, Total 06/17/2021 0.8  0.3 - 1.2 mg/dL Final  . Protein, Total 06/17/2021 7.0  6.1 - 7.9 g/dL Final  . A/G Ratio 91/73/7977 1.5  1.0 - 5.0 gm/dL Final    PHQ 2/9 last 3 flowsheet values     11/24/2020 02/21/2021  PHQ-2/9 Depression Screening   Little interest or pleasure in doing things 0 0  Feeling down, depressed, or hopeless (or irritable for Teens only)? 0 0  Total Prescreening Score 0 0  Total Score = 0 0      Multiple values from one day are sorted in reverse-chronological order   Depression Severity and Treatment Recommendations:  0-4= None  5-9= Mild / Treatment: Support, educate to call if worse; return in one month  10-14= Moderate / Treatment: Support, watchful waiting; Antidepressant or Psychotherapy  15-19= Moderately severe / Treatment: Antidepressant OR Psychotherapy  >= 20 = Major depression, severe / Antidepressant AND Psychotherapy   Goals    .  Lose Weight (pt-stated)      Patient would like to lose around 25 lbs over the next year for a goal weight of 175-180lbs.  Patient would like to achieve this by increasing exercise and being more mindful of diet.       Assessment  Gary Wallace is a 71 y.o. male here for follow-up evaluation  Recommendations Diabetes -A1c remains above goal with glip 10 bid and met 1000 am 500 pm. Some mild GI side effects  with met so cant increase Start jardiance 10 mg and recheck 3 months. Is on statin and ARB.   Lab Results  Component Value Date   HGBA1C 8.6 (H) 10/19/2021   HGBA1C 8.0 (H) 06/17/2021   Eye exam Dr Carolee in Jan 2022 per pt  Hyperlipidemia - In the past had been on simvastatin but had leg pain with it.  Tolerating crestor and LDL 62. Last Lipids: Lab Results  Component Value Date   CHOLTOTAL 129 06/17/2021   LDLCALC 62 06/17/2021   HDL 43.8 06/17/2021   TRIG 116 06/17/2021   Lab  Results  Component Value Date   ALT 36 10/19/2021   AST 40 (H) 10/19/2021   ALKPHOS 55 10/19/2021    Hypertension - BP was  well controlled at home on lisinopril  but has chronci cough. Changed to losartan 50 mg and cough gone and BP good. Most recent basic metabolic panel stable.  Lab Results  Component Value Date   CREATININE 1.0 10/19/2021   BUN 17 10/19/2021   NA 139 10/19/2021   K 4.6 10/19/2021   CL 102 10/19/2021   CO2 29.8 10/19/2021   Intermittent chest pain -he has seen cardiology.  He has had a stress echo that was negative.  Plan was to defer cardiac cath.  Fatty liver - discussed diet and exercise  US  2019 IMPRESSION:  Mildly increased echogenicityof hepatic parenchyma is noted  suggesting fatty infiltration or other diffuse hepatocellular  disease. No other significant abnormality seen in the abdomen.   OSA - uses CPAP with good response.  OA - follows with ortho and has had knee injections  R leg parasthesia sxs- improved.  B12 def -s/p repleteion  BPH - decreased urine stream,  Also has buried penis.  Refer to urology.  No follow-ups on file.

## 2021-11-03 ENCOUNTER — Ambulatory Visit: Payer: Medicare HMO | Admitting: Urology

## 2021-11-03 ENCOUNTER — Encounter: Payer: Self-pay | Admitting: Urology

## 2021-11-03 ENCOUNTER — Other Ambulatory Visit: Payer: Self-pay

## 2021-11-03 VITALS — BP 132/85 | HR 69 | Ht 69.0 in | Wt 198.0 lb

## 2021-11-03 DIAGNOSIS — N4883 Acquired buried penis: Secondary | ICD-10-CM

## 2021-11-03 DIAGNOSIS — R102 Pelvic and perineal pain: Secondary | ICD-10-CM

## 2021-11-03 DIAGNOSIS — N400 Enlarged prostate without lower urinary tract symptoms: Secondary | ICD-10-CM | POA: Diagnosis not present

## 2021-11-03 LAB — BLADDER SCAN AMB NON-IMAGING: Scan Result: 53

## 2021-11-03 NOTE — Progress Notes (Signed)
11/03/2021 9:24 AM   Gary Wallace 1951/08/17 [id number]  Referring provider: Leonel Ramsay, MD [place],  [place] [address]  Chief Complaint  Patient presents with   Benign Prostatic Hypertrophy    HPI: Gary Wallace is a 71 y.o. male referred for evaluation of BPH.  More concerned about "penile shrinkage" and bilateral pelvic pain IPSS 16/35 Presently states his voiding symptoms are not bothersome and does not desire treatment Intermittent bilateral pelvic pain; severity mild-moderate without identifiable precipitating, aggravating or alleviating factors Denies dysuria or gross hematuria PSA May 2022 0.58    PMH: Past Medical History:  Diagnosis Date   Hyperlipemia    Hypertension     Surgical History: Past Surgical History:  Procedure Laterality Date   PERIPHERAL VASCULAR CATHETERIZATION Right 05/27/2015   Procedure: Upper Extremity Angiography;  Surgeon: Algernon Huxley, MD;  Location: [facility];  Service: Cardiovascular;  Laterality: Right;   PERIPHERAL VASCULAR CATHETERIZATION Right 05/27/2015   Procedure: Upper Extremity Intervention;  Surgeon: Algernon Huxley, MD;  Location: [facility];  Service: Cardiovascular;  Laterality: Right;    Home Medications:  Allergies as of 11/03/2021   No Known Allergies      Medication List        Accurate as of November 03, 2021  9:24 AM. If you have any questions, ask your nurse or doctor.          aspirin EC 81 MG tablet Take 81 mg by mouth daily.   azelastine 0.1 % nasal spray Commonly known as: ASTELIN Place 2 sprays into both nostrils 2 (two) times daily. Use in each nostril as directed   benzonatate 100 MG capsule Commonly known as: TESSALON Take 1 capsule (100 mg total) by mouth 3 (three) times daily as needed.   cetirizine 10 MG tablet Commonly known as: ZYRTEC Take 10 mg by mouth daily.   fluticasone 50 MCG/ACT nasal spray Commonly known as:  FLONASE Place 2 sprays into both nostrils daily.   glipiZIDE 10 MG tablet Commonly known as: GLUCOTROL Take 1 tablet (10 mg total) by mouth daily before breakfast.   lisinopril 20 MG tablet Commonly known as: ZESTRIL Take 1 tablet (20 mg total) by mouth daily.   losartan 50 MG tablet Commonly known as: COZAAR Take 50 mg by mouth daily.   metFORMIN 500 MG tablet Commonly known as: GLUCOPHAGE Take by mouth 2 (two) times daily with a meal.   multivitamin with minerals tablet Take 1 tablet by mouth daily.   pantoprazole 20 MG tablet Commonly known as: PROTONIX Take 20 mg by mouth daily.   simvastatin 20 MG tablet Commonly known as: ZOCOR Take 20 mg by mouth daily.        Allergies: No Known Allergies  Family History: History reviewed. No pertinent family history.  Social History:  reports that he quit smoking about 23 years ago. His smoking use included cigarettes. He has a 30.00 pack-year smoking history. He has never used smokeless tobacco. He reports that he does not drink alcohol and does not use drugs.   Physical Exam: BP 132/85    Pulse 69    Ht 5\' 9"  (1.753 m)    Wt 198 lb (89.8 kg)    BMI 29.24 kg/m   Constitutional:  Alert, no acute distress. HEENT: [place] AT, moist mucus membranes.  Trachea midline, no masses. Cardiovascular: No clubbing, cyanosis, or edema. Respiratory: Normal respiratory effort, no increased work of breathing. GI:  Abdomen is soft, nontender, nondistended, no abdominal masses.  No hernia appreciated GU: Phallus uncircumcised.  Foreskin easily retracts.  Penile retraction into suprapubic fat but not trapped.  Testes descended bilaterally without masses or tenderness.  Prostate 40 g, smooth without nodules Skin: No rashes, bruises or suspicious lesions. Neurologic: Grossly intact, no focal deficits, moving all 4 extremities. Psychiatric: Normal mood and affect.  Laboratory Data:  Urinalysis Dipstick 3+ glucose/trace ketones Microscopy  negative   Assessment & Plan:    1. Benign prostatic hyperplasia with LUTS Moderate LUTS which are not bothersome Bladder scan PVR 53 mL We discussed medical management of BPH however he states his symptoms are not bothersome enough that he desires to take additional medication  2.  Pelvic pain Undetermined etiology; no abnormalities on exam Schedule pelvic CT, will call with results  3.  Penile retraction Retraction into suprapubic fat Circumcision would not improve and could result in a buried penis He is working on weight loss   Abbie Sons, [place] [address], [place], [place] [address] [phone]

## 2021-11-04 LAB — URINALYSIS, COMPLETE
Bilirubin, UA: NEGATIVE
Leukocytes,UA: NEGATIVE
Nitrite, UA: NEGATIVE
Protein,UA: NEGATIVE
RBC, UA: NEGATIVE
Specific Gravity, UA: 1.02 (ref 1.005–1.030)
Urobilinogen, Ur: 0.2 mg/dL (ref 0.2–1.0)
pH, UA: 5.5 (ref 5.0–7.5)

## 2021-11-04 LAB — MICROSCOPIC EXAMINATION
Bacteria, UA: NONE SEEN
RBC: NONE SEEN /hpf (ref 0–2)

## 2021-11-25 ENCOUNTER — Ambulatory Visit: Payer: Medicare HMO

## 2021-11-28 ENCOUNTER — Ambulatory Visit
Admission: RE | Admit: 2021-11-28 | Discharge: 2021-11-28 | Disposition: A | Discharge status: Home / Self Care | Payer: Medicare HMO | Source: Ambulatory Visit | Attending: Urology | Admitting: Urology

## 2021-11-28 ENCOUNTER — Other Ambulatory Visit: Payer: Self-pay

## 2021-11-28 DIAGNOSIS — R102 Pelvic and perineal pain: Secondary | ICD-10-CM | POA: Insufficient documentation

## 2021-11-30 ENCOUNTER — Encounter: Payer: Self-pay | Admitting: *Deleted

## 2022-02-20 DEATH — deceased

## 2022-04-07 IMAGING — CT CT PELVIS W/O CM
2 of 3 series · 17 of 46 positions shown, 19 images · non-contrast
Comparison: None.

CLINICAL DATA: Pelvic pain.  RIGHT groin pain for 1 year

EXAM:
CT PELVIS WITHOUT CONTRAST
TECHNIQUE: Multidetector CT imaging of the pelvis was performed following the
standard protocol without intravenous contrast.
RADIATION DOSE REDUCTION: This exam was performed according to the
departmental dose-optimization program which includes automated
exposure control, adjustment of the mA and/or kV according to
patient size and/or use of iterative reconstruction technique.

[Series 2: axials routine abdomen pelvis without 5.00 · axial · non-contrast · 0.68mm/px · z∈[-1621,-1361]mm · 14 of 60 slices shown, 16 images]
[im 4/60  soft-tissue]
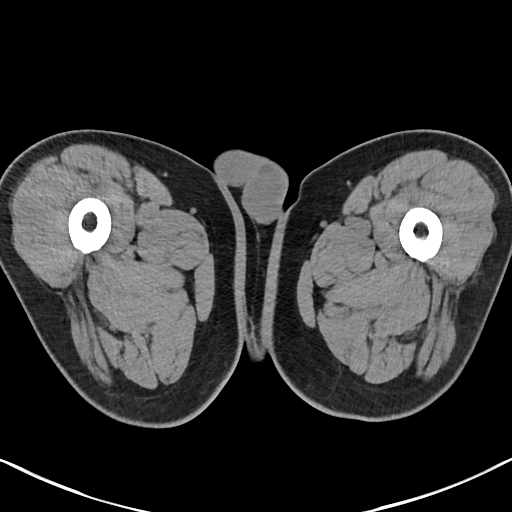
[im 4/60  bone]
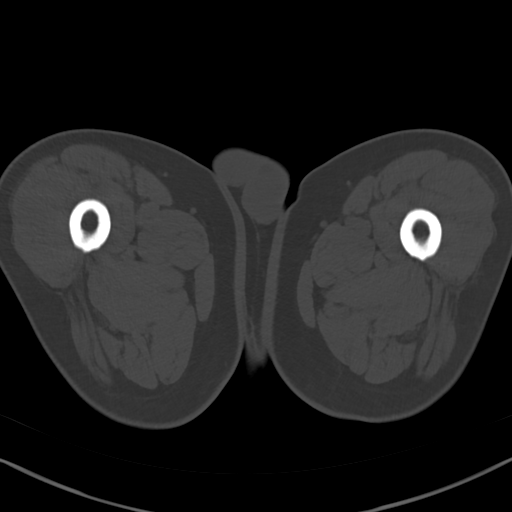
[im 8/60  soft-tissue]
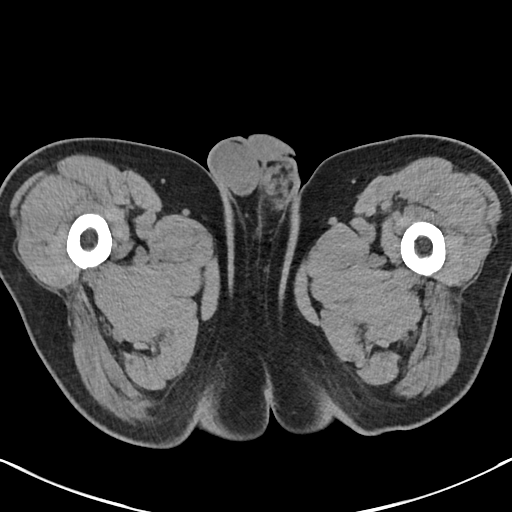
[im 12/60  soft-tissue]
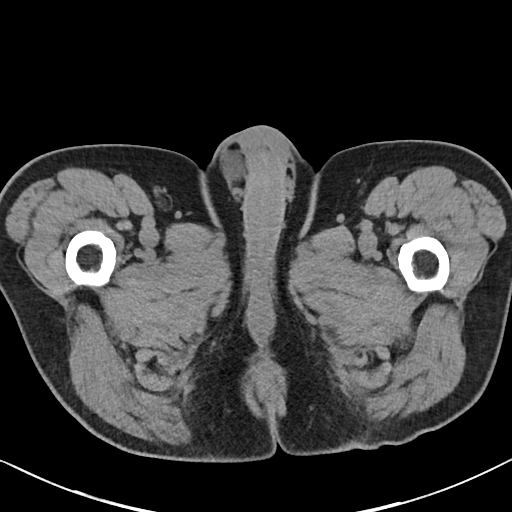
[im 16/60  soft-tissue]
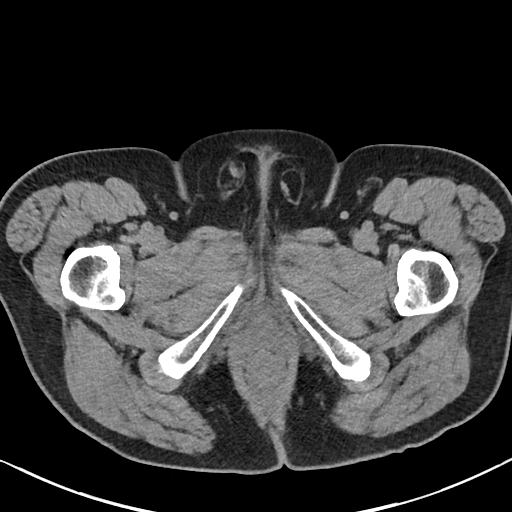
[im 20/60  soft-tissue]
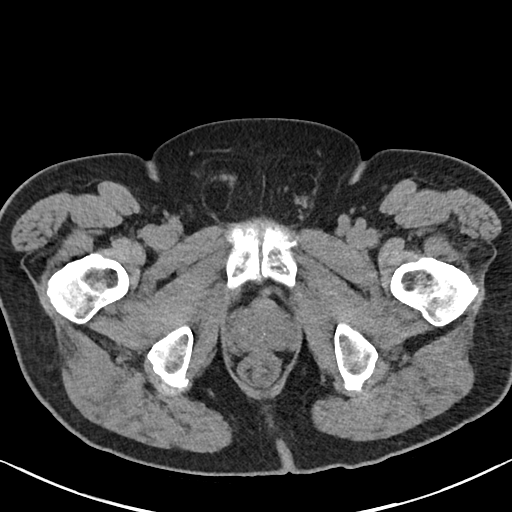
[im 23/60  soft-tissue]
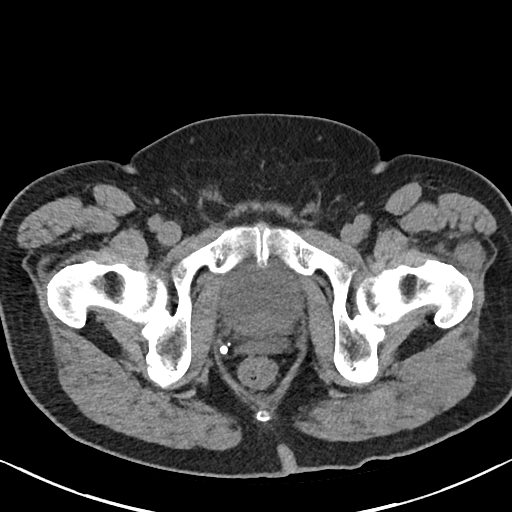
[im 27/60  soft-tissue]
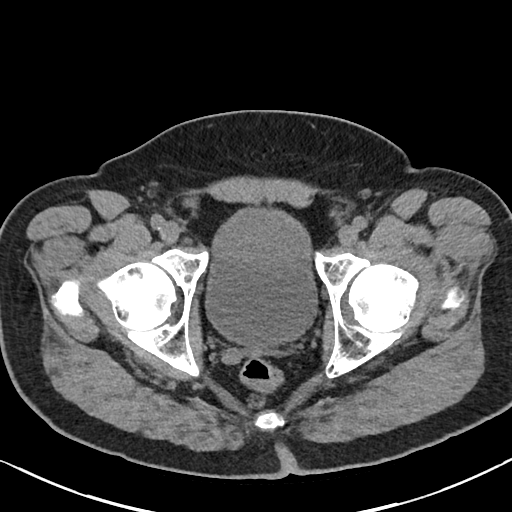
[im 33/60  soft-tissue]
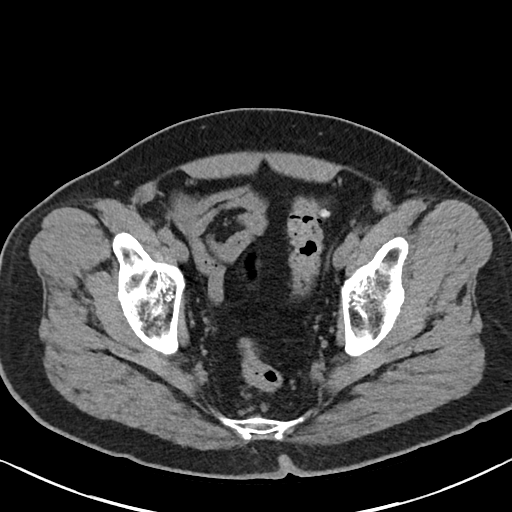
[im 37/60  soft-tissue]
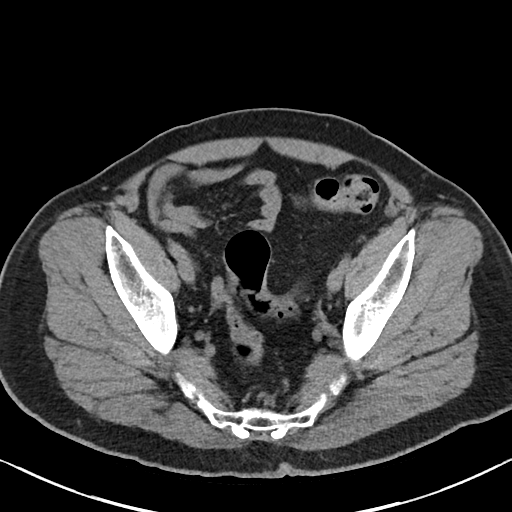
[im 37/60  bone]
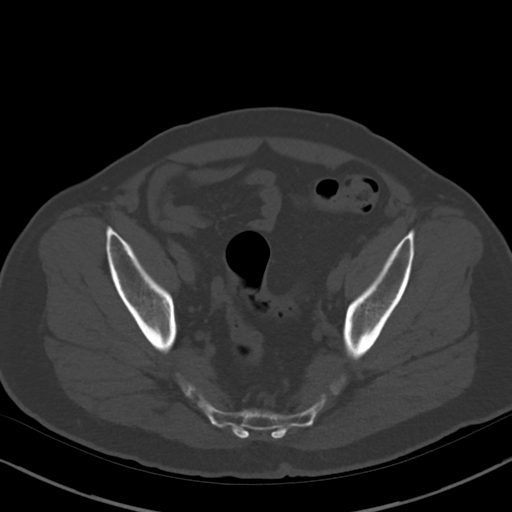
[im 40/60  soft-tissue]
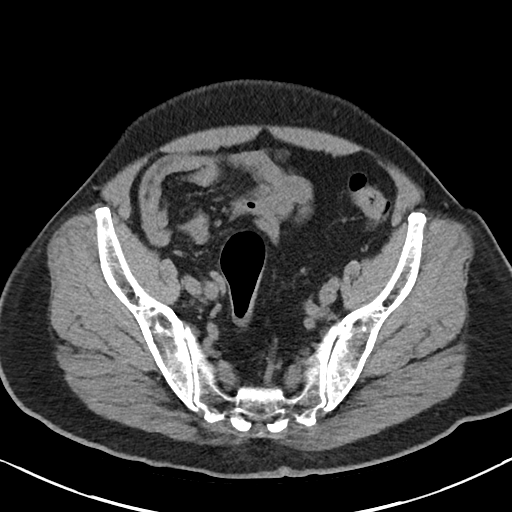
[im 44/60  soft-tissue]
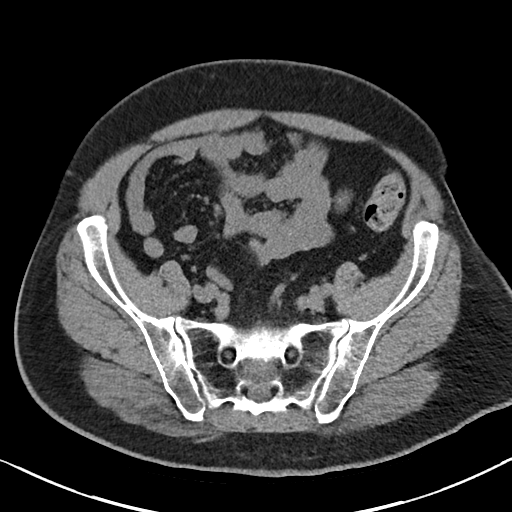
[im 48/60  soft-tissue]
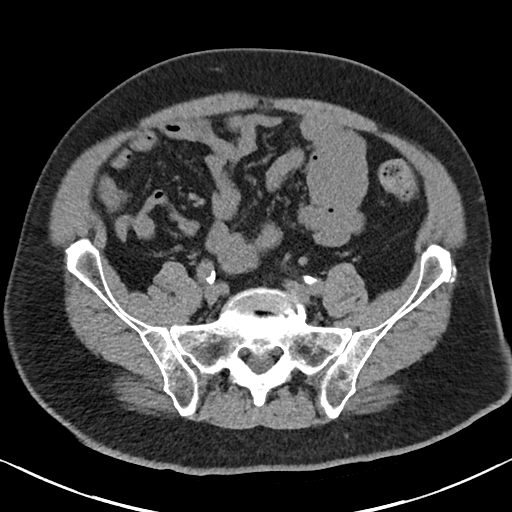
[im 52/60  soft-tissue]
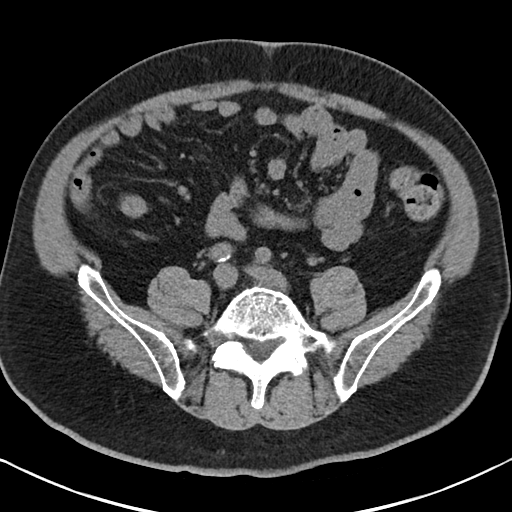
[im 56/60  soft-tissue]
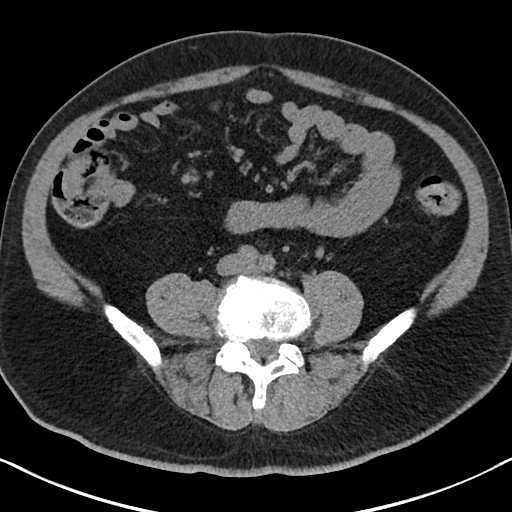

[Series 3: coronals routine abdomen pelvis without 2.00 cor · coronal · non-contrast · 0.59mm/px · 3 of 150 slices shown]
[im 50/150  soft-tissue]
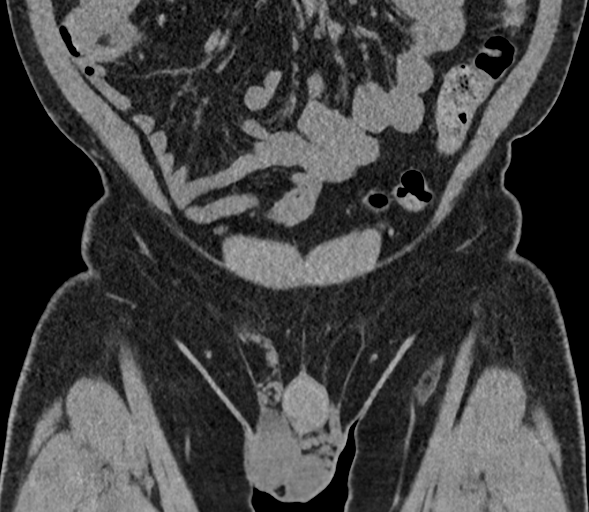
[im 67/150  soft-tissue]
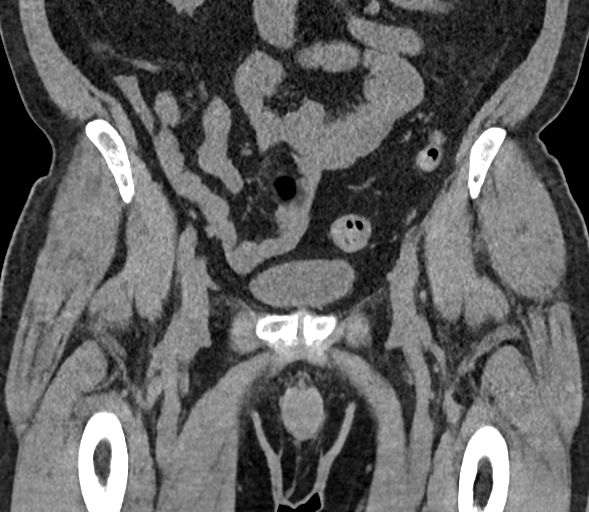
[im 83/150  soft-tissue]
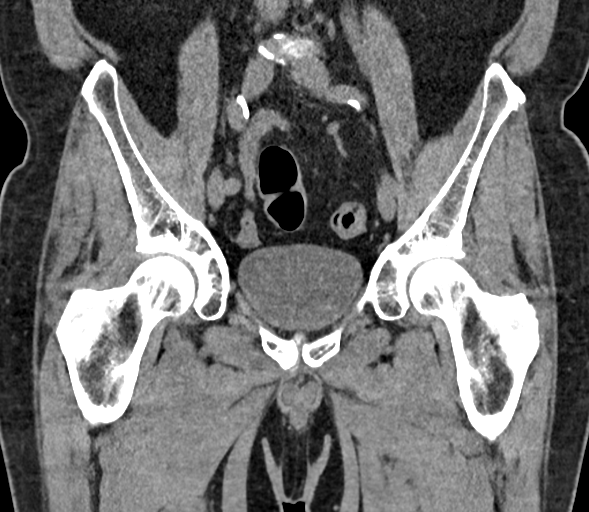

[17 of 46 positions shown; findings below may reference images not displayed]

FINDINGS: Urinary Tract:  Distal ureters and bladder normal.

Bowel:  No diverticulosis.  Normal volume stool.

Vascular/Lymphatic: Calcification the abdominal aorta. No
lymphadenopathy

Reproductive:  Prostate unremarkable

Other:  Bilateral small fat filled inguinal hernias.

Musculoskeletal: No aggressive osseous lesion
IMPRESSION: 1. No explanation for RIGHT groin pain.
2. Small bilateral fat filled inguinal hernias.
3. Aortic Atherosclerosis ([id number]).
